# Patient Record
Sex: Female | Born: 1975
Health system: Southern US, Community
[De-identification: ages and names within clinical notes are randomized; demographics above are authoritative.]

## PROBLEM LIST (undated history)

## (undated) DIAGNOSIS — J309 Allergic rhinitis, unspecified: Secondary | ICD-10-CM

## (undated) DIAGNOSIS — K219 Gastro-esophageal reflux disease without esophagitis: Secondary | ICD-10-CM

## (undated) DIAGNOSIS — J189 Pneumonia, unspecified organism: Secondary | ICD-10-CM

## (undated) DIAGNOSIS — M722 Plantar fascial fibromatosis: Secondary | ICD-10-CM

## (undated) DIAGNOSIS — E785 Hyperlipidemia, unspecified: Secondary | ICD-10-CM

## (undated) DIAGNOSIS — J45909 Unspecified asthma, uncomplicated: Secondary | ICD-10-CM

## (undated) DIAGNOSIS — B029 Zoster without complications: Secondary | ICD-10-CM

## (undated) HISTORY — DX: Plantar fascial fibromatosis: M72.2

## (undated) HISTORY — DX: Zoster without complications: B02.9

## (undated) HISTORY — DX: Unspecified asthma, uncomplicated: J45.909

## (undated) HISTORY — PX: WISDOM TOOTH EXTRACTION: SHX21

## (undated) HISTORY — DX: Pneumonia, unspecified organism: J18.9

## (undated) HISTORY — DX: Gastro-esophageal reflux disease without esophagitis: K21.9

## (undated) HISTORY — DX: Hyperlipidemia, unspecified: E78.5

## (undated) HISTORY — DX: Allergic rhinitis, unspecified: J30.9

---

## 2001-10-26 ENCOUNTER — Other Ambulatory Visit: Admission: RE | Admit: 2001-10-26 | Discharge: 2001-10-26 | Payer: Self-pay | Admitting: *Deleted

## 2002-12-05 ENCOUNTER — Other Ambulatory Visit: Admission: RE | Admit: 2002-12-05 | Discharge: 2002-12-05 | Payer: Self-pay | Admitting: Obstetrics and Gynecology

## 2003-11-25 ENCOUNTER — Other Ambulatory Visit: Admission: RE | Admit: 2003-11-25 | Discharge: 2003-11-25 | Payer: Self-pay | Admitting: Obstetrics and Gynecology

## 2005-01-20 ENCOUNTER — Other Ambulatory Visit: Admission: RE | Admit: 2005-01-20 | Discharge: 2005-01-20 | Payer: Self-pay | Admitting: Obstetrics and Gynecology

## 2006-01-31 ENCOUNTER — Other Ambulatory Visit: Admission: RE | Admit: 2006-01-31 | Discharge: 2006-01-31 | Payer: Self-pay | Admitting: Obstetrics and Gynecology

## 2006-01-31 ENCOUNTER — Ambulatory Visit (HOSPITAL_COMMUNITY): Admission: RE | Admit: 2006-01-31 | Discharge: 2006-01-31 | Payer: Self-pay | Admitting: Obstetrics and Gynecology

## 2006-06-18 ENCOUNTER — Inpatient Hospital Stay (HOSPITAL_COMMUNITY): Admission: RE | Admit: 2006-06-18 | Discharge: 2006-06-20 | Payer: Self-pay | Admitting: Obstetrics and Gynecology

## 2008-10-03 ENCOUNTER — Inpatient Hospital Stay (HOSPITAL_COMMUNITY): Admission: AD | Admit: 2008-10-03 | Discharge: 2008-10-04 | Payer: Self-pay | Admitting: Obstetrics and Gynecology

## 2010-02-13 ENCOUNTER — Emergency Department (HOSPITAL_COMMUNITY): Admission: EM | Admit: 2010-02-13 | Discharge: 2010-02-13 | Payer: Self-pay | Admitting: Family Medicine

## 2010-02-13 ENCOUNTER — Emergency Department (HOSPITAL_COMMUNITY): Admission: EM | Admit: 2010-02-13 | Discharge: 2010-02-13 | Payer: Self-pay | Admitting: Emergency Medicine

## 2010-10-25 LAB — URINE MICROSCOPIC-ADD ON

## 2010-10-25 LAB — URINE CULTURE: Colony Count: 45000

## 2010-10-25 LAB — URINALYSIS, ROUTINE W REFLEX MICROSCOPIC
Ketones, ur: 15 mg/dL — AB
Leukocytes, UA: NEGATIVE
Nitrite: NEGATIVE
Protein, ur: 30 mg/dL — AB

## 2010-10-25 LAB — CBC
Hemoglobin: 12.6 g/dL (ref 12.0–15.0)
MCH: 31.2 pg (ref 26.0–34.0)
MCHC: 34.7 g/dL (ref 30.0–36.0)
RBC: 4.05 MIL/uL (ref 3.87–5.11)
WBC: 9.3 10*3/uL (ref 4.0–10.5)

## 2010-10-25 LAB — DIFFERENTIAL
Basophils Absolute: 0 10*3/uL (ref 0.0–0.1)
Basophils Relative: 0 % (ref 0–1)
Lymphocytes Relative: 8 % — ABNORMAL LOW (ref 12–46)
Monocytes Absolute: 0.6 10*3/uL (ref 0.1–1.0)

## 2010-10-25 LAB — COMPREHENSIVE METABOLIC PANEL
AST: 16 U/L (ref 0–37)
Alkaline Phosphatase: 46 U/L (ref 39–117)
BUN: 11 mg/dL (ref 6–23)
Calcium: 7.4 mg/dL — ABNORMAL LOW (ref 8.4–10.5)
Creatinine, Ser: 0.76 mg/dL (ref 0.4–1.2)
GFR calc non Af Amer: >60 mL/min (ref >60)
Sodium: 138 mEq/L (ref 135–145)

## 2010-10-25 LAB — POCT URINALYSIS DIP (DEVICE)
Protein, ur: 100 mg/dL — AB
Urobilinogen, UA: 0.2 mg/dL (ref 0.0–1.0)
pH: 6 (ref 5.0–8.0)

## 2010-11-24 LAB — CBC
HCT: 36.2 % (ref 36.0–46.0)
Hemoglobin: 12.1 g/dL (ref 12.0–15.0)
MCHC: 33.3 g/dL (ref 30.0–36.0)
MCHC: 33.4 g/dL (ref 30.0–36.0)
MCV: 89.8 fL (ref 78.0–100.0)
MCV: 91.3 fL (ref 78.0–100.0)
Platelets: 273 10*3/uL (ref 150–400)
Platelets: 303 10*3/uL (ref 150–400)
RBC: 3.96 MIL/uL (ref 3.87–5.11)
RBC: 4.43 MIL/uL (ref 3.87–5.11)
RDW: 13.6 % (ref 11.5–15.5)
RDW: 14.5 % (ref 11.5–15.5)
WBC: 17.2 10*3/uL — ABNORMAL HIGH (ref 4.0–10.5)

## 2010-11-24 LAB — RPR: RPR Ser Ql: NONREACTIVE

## 2010-12-22 NOTE — Discharge Summary (Signed)
NAMECATALEAH, STITES                 ACCOUNT NO.:  [id number]   MEDICAL RECORD NO.:  [id number]          PATIENT TYPE:  INP   LOCATION:  9130                          FACILITY:  [facility]   PHYSICIAN:  Malachi Pro. Ambrose Mantle, M.D. DATE OF BIRTH:  10/21/1975   DATE OF ADMISSION:  10/03/2008  DATE OF DISCHARGE:  10/04/2008                               DISCHARGE SUMMARY   A 35 year old white married female para 1-0-1-1, gravida 3, EDC October 06, 2008, admitted in labor.  Blood group and type A+, negative  antibody, nonreactive serology, rubella immune, hepatitis B surface  antigen negative, HIV negative, GC and Chlamydia negative, quad screen  negative, 1-hour Glucola 99, group B strep negative, and cystic fibrosis  negative.  Vaginal ultrasound on February 27, 2008, crown-rump length 1.8  cm, 8 weeks 2 days, [facility] October 06, 2008.  Ultrasound on May 07, 2008, average gestational age [redacted] weeks 1 day, [facility] October 07, 2008.  Echogenic intracardiac focus was noted in the left ventricle.  Prenatal  care was uncomplicated.   PAST MEDICAL HISTORY:   ALLERGIES:  CODEINE causes vomiting, illnesses, and urinary tract  infections in the past.   OPERATIONS:  Wisdom teeth extracted.   FAMILY HISTORY:  Grandparents with heart disease and high blood  pressure.  Maternal grandmother with ovarian cancer.  Maternal  grandfather prostate and pancreatic cancer and diabetes mellitus.  Alcohol, tobacco, and drugs none.   OBSTETRIC HISTORY:  In September 2008, the patient had a missed abortion  with a D&C in November 2007, a 6 pounds 15 ounces female delivered  vaginally.  Vital signs on admission were normal.  Heart and lungs were  normal.  The abdomen was soft.  The fundal height was 38 cm.  On  September 25, 2008, fetal heart tones were normal.  The cervix was 5-6 cm  per the MAU nurse.  The patient received an epidural at 4:00 a.m.  The  cervix was 7-cm dilated with a 100% vertex at a -2.  Artificial rupture  of the membranes produced clear fluid.  After the epidural, the patient  became hypotensive to 47/39 and fetal bradycardia occurred.  The patient  positioned on her side and given ephedrine, O2 was begun, and the fetal  heart rate recovered in 3 minutes.  At 4:30 a.m., the patient was fully  dilated.  She became uncomfortable and was crying in pain with pushing.  Her movements made tracing of the fetal heart rate difficult, so was 6  cm of caput showing.  A Kiwi vacuum was placed with one contraction and  no pop offs, a living female infant 6 pounds 9 ounces was delivered OA  over a second-degree midline laceration and a long right labial  laceration that extended to the clitoris.  The laceration also involved  the right vaginal wall.  Xylocaine 1% was used for the repair because  the patient was uncomfortable in spite of the epidural.  Placenta was  intact, the repair was done, and the uterus was normal.  The repair was  done with 3-0 Vicryl attempting  to put the right labia back to its  normal anatomy.  Postpartum, the patient did well.  On the first  postpartum day, she was ready for discharge.  I counseled her  significantly about the fact that it was 1 day earlier than the lot of  times the patient's use, but she and her husband felt that she would do  better at home.  There was plenty of family support.  I checked with the  pediatrician, Dr. Carlean Purl, and he had no objections to discharging  the baby.  Initial hemoglobin 13.2, hematocrit 39.8, white count 17,200,  and platelet count 303,000.  Followup hemoglobin 12.1, hematocrit 36.2,  and RPR nonreactive.   FINAL DIAGNOSES:  Intrauterine pregnancy, 39+ weeks, delivered occiput  anterior.   OPERATION:  Vacuum-assisted vaginal delivery OA, repair of extensive  right labial, vaginal laceration, and second-degree midline laceration.   FINAL CONDITION:  Improved.   INSTRUCTIONS:  Our regular discharge instruction booklet.   Percocet  5/325, 36 tablets 1 every 4-6 hours as needed for pain and Motrin 600 mg  30 tablets 1 every 6 hours as needed for pain.  She is to return in 6  weeks for followup examination.      Malachi Pro. Ambrose Mantle, M.D.  Electronically Signed     TFH/MEDQ  D:  10/04/2008  T:  10/04/2008  Job:  [id number]

## 2010-12-25 NOTE — Discharge Summary (Signed)
Cassandra Murphy, Cassandra Murphy                 ACCOUNT NO.:  [id number]   MEDICAL RECORD NO.:  [id number]          PATIENT TYPE:  INP   LOCATION:  9124                          [facility]ILITY:  [facility]   PHYSICIAN:  Sherron Monday, MD        DATE OF BIRTH:  Jul 08, 1976   DATE OF ADMISSION:  06/18/2006  DATE OF DISCHARGE:  06/20/2006                                 DISCHARGE SUMMARY   ADMISSION DIAGNOSES:  1. Intrauterine pregnancy at term.  2. Spontaneous rupture of membranes.   DISCHARGE DIAGNOSES:  1. Intrauterine pregnancy at term, delivered via spontaneous vaginal      delivery.  2. Spontaneous rupture of membranes.   HISTORY OF PRESENT ILLNESS:  A 35 year old G2, P0-0-1-0, at 40-0/7 weeks,  with SROM at 6:45 a.m.  She describes it as clear, pinkish fluid, good fetal  movement, vaginal bleeding that she describes as pinkish and contractions  approximately every 8 minutes.  States she has been having back labor.   PAST MEDICAL HISTORY:  Not significant.   PAST MEDICAL HISTORY:  Significant for a D&C.   PAST OB/GYN HISTORY:  G1 was a missed abortion, with a D&C.  G2 is present  pregnancy.  No complications.  History of an abnormal Pap smear with a  colposcopy, and it has been normal since.  No history of any sexually  transmitted diseases.   MEDICATIONS:  Prenatal vitamins.   ALLERGIES:  CODEINE, which causes nausea and vomiting.   SOCIAL HISTORY:  She denies alcohol, tobacco or drugs.  Married.   Of note, she got early prenatal care in [place], and these records have been  obtained.  On an anatomy scan, the infant is noted to have an EIF.  However,  otherwise normal anatomy at an approximately 20-week scan, confirming the  [facility] of June 18, 2006.   On admission she was afebrile, vital signs are stable.  Fetal heart tones in  the 130s and reactive.  Contractions every 3 minutes.  Sterile vaginal exam  was 6, 90, and 0.   PRENATAL LABORATORY DATA:  HIV negative.  Hepatitis B surface antigen  negative.  Rubella immune.  RPR nonreactive.  She was A positive, antibody  screen negative.  Platelets 268,000.  Group B strep negative.  Glucola 83.  Her dates were confirmed by a first trimester ultrasound in [place].   This 35 year old at 40 weeks in active labor was admitted.  We anticipated a  vaginal delivery.  Group B strep was negative, so there was no need for  prophylaxis.  She was monitored closely.  She progressed to complete,  complete, +2, fetal position was noted to be in OT.  After pushing for  approximately 45 minutes, a combination of manual rotation and exaggerated  left Sims position, turning her epidural down, we delayed pushing for an  hour to help the baby rotate, and she went on to push to deliver a viable  female infant at 28 with Apgars of 8 at one minute and 9 at five minutes  and a weight of 6 pounds 15 ounces.  Midline episiotomy was cut.  Placenta  was manually extracted in its entirety after inspection.  A second degree  perineal laceration was repaired in typical fashion with 3-0 Vicryl.  Estimated blood loss was approximately 500 mL.  Her postpartum course was  relatively uncomplicated.  She remained afebrile with vital signs stable,  and her hemoglobin decreased from 14.7 to 13.4.  On the day of discharge she  complained of some fullness and swelling in her vulva, had normal lochia,  and her pain was well-controlled.  She was discharged to home with routine  discharge instructions and numbers to call if any questions or problems.  She was also given prescriptions for Motrin, Vicodin, Colace, and prenatal  vitamins.  She will follow up for a 6-week check-up.  She was A positive,  rubella immune, plans to breast-feed, and will start oral contraceptive at  her 6-week check-up.      Sherron Monday, MD  Electronically Signed     JB/MEDQ  D:  06/20/2006  T:  06/20/2006  Job:  [id number]

## 2011-05-18 ENCOUNTER — Inpatient Hospital Stay (INDEPENDENT_AMBULATORY_CARE_PROVIDER_SITE_OTHER)
Admission: RE | Admit: 2011-05-18 | Discharge: 2011-05-18 | Disposition: A | Discharge status: Home / Self Care | Payer: BC Managed Care – PPO | Source: Ambulatory Visit | Attending: Emergency Medicine | Admitting: Emergency Medicine

## 2011-05-18 DIAGNOSIS — J40 Bronchitis, not specified as acute or chronic: Secondary | ICD-10-CM

## 2011-05-18 LAB — POCT URINALYSIS DIP (DEVICE)
Specific Gravity, Urine: 1.025 (ref 1.005–1.030)
pH: 5.5 (ref 5.0–8.0)

## 2011-05-18 LAB — POCT PREGNANCY, URINE: Preg Test, Ur: NEGATIVE

## 2012-12-21 ENCOUNTER — Ambulatory Visit (INDEPENDENT_AMBULATORY_CARE_PROVIDER_SITE_OTHER): Payer: BC Managed Care – PPO | Admitting: Psychology

## 2012-12-21 DIAGNOSIS — IMO0002 Reserved for concepts with insufficient information to code with codable children: Secondary | ICD-10-CM

## 2015-09-16 ENCOUNTER — Other Ambulatory Visit: Payer: Self-pay | Admitting: Obstetrics and Gynecology

## 2015-09-16 DIAGNOSIS — R928 Other abnormal and inconclusive findings on diagnostic imaging of breast: Secondary | ICD-10-CM

## 2015-09-25 ENCOUNTER — Ambulatory Visit
Admission: RE | Admit: 2015-09-25 | Discharge: 2015-09-25 | Disposition: A | Discharge status: Home / Self Care | Payer: BLUE CROSS/BLUE SHIELD | Source: Ambulatory Visit | Attending: Obstetrics and Gynecology | Admitting: Obstetrics and Gynecology

## 2015-09-25 DIAGNOSIS — R928 Other abnormal and inconclusive findings on diagnostic imaging of breast: Secondary | ICD-10-CM

## 2015-11-10 ENCOUNTER — Ambulatory Visit (INDEPENDENT_AMBULATORY_CARE_PROVIDER_SITE_OTHER): Payer: BLUE CROSS/BLUE SHIELD | Admitting: Sports Medicine

## 2015-11-10 ENCOUNTER — Encounter: Payer: Self-pay | Admitting: Sports Medicine

## 2015-11-10 VITALS — BP 111/75 | Ht 68.0 in | Wt 165.0 lb

## 2015-11-10 DIAGNOSIS — M25571 Pain in right ankle and joints of right foot: Secondary | ICD-10-CM

## 2015-11-10 DIAGNOSIS — M357 Hypermobility syndrome: Secondary | ICD-10-CM

## 2015-11-10 DIAGNOSIS — Q796 Ehlers-Danlos syndrome: Secondary | ICD-10-CM | POA: Diagnosis not present

## 2015-11-10 DIAGNOSIS — Q7962 Hypermobile Ehlers-Danlos syndrome: Secondary | ICD-10-CM | POA: Insufficient documentation

## 2015-11-10 DIAGNOSIS — M25371 Other instability, right ankle: Secondary | ICD-10-CM

## 2015-11-10 DIAGNOSIS — M25373 Other instability, unspecified ankle: Secondary | ICD-10-CM | POA: Insufficient documentation

## 2015-11-10 NOTE — Assessment & Plan Note (Signed)
Follow but use HEP for prevention

## 2015-11-10 NOTE — Assessment & Plan Note (Signed)
HEP Emphasize balance and proprioception Ankle compression sleeve for exercise on RT  Reck pending response Needs good foot support as well

## 2015-11-10 NOTE — Progress Notes (Signed)
Patient ID: Cassandra Murphy, female   DOB: April 10, 1976, 40 y.o.   MRN: [id number]  CC: RT ankle pain  2 wks ago turned RT ankle walking in house Mild swelling Has had multiple ankle sprains on RT Lt ankle has been OK Sprains resolve quickly but has stopped tennis and exercises that involve jumping  Hx of PF pain on RT  Hx of a lot of flexibility - for Yoga, etc  Soc HX: Does regular exercise Non smoker Non drinker  ROS Denies swelling of other joints Slight tingling and numbness of RT foot dorsum and ankle  Gen WD/WN W F NAD BP 111/75 mmHg  Ht 5\' 8"  (1.727 m)  Wt 165 lb (74.844 kg)  BMI 25.09 kg/m2 Beighton score of 3 Hands to floor and thumbs to wrist Increased laxity of other joints  Lt ankle Ankle: No visible erythema or swelling. Range of motion is full in all directions. Strength is 5/5 in all directions. Stable but increased motion of lateral and medial ligaments;  + drawer squeeze test and kleiger test unremarkable; Talar dome nontender; No pain at base of 5th MT; No tenderness over cuboid; No tenderness over N spot or navicular prominence No tenderness on posterior aspects of lateral and medial malleolus No sign of peroneal tendon subluxations; Negative tarsal tunnel tinel's Able to walk 4 steps.  RT ankle Ankle: No visible erythema or swelling. Range of motion is full in all directions. Strength is 5/5 in all directions. unstable lateral ligaments with no good endpoints and + drawer stable medial ligaments; squeeze test and kleiger test unremarkable; Talar dome nontender; No pain at base of 5th MT; No tenderness over cuboid; No tenderness over N spot or navicular prominence No tenderness on posterior aspects of lateral and medial malleolus No sign of peroneal tendon subluxations; Negative tarsal tunnel tinel's Able to walk 4 steps.  PF is not tender  Feet: collapse of long arch bilat Slaying toes 1/2 Eversion of foot on ankle Partially corrects  with gait

## 2015-11-12 ENCOUNTER — Ambulatory Visit: Payer: BLUE CROSS/BLUE SHIELD | Admitting: Sports Medicine

## 2015-11-25 DIAGNOSIS — J3489 Other specified disorders of nose and nasal sinuses: Secondary | ICD-10-CM | POA: Insufficient documentation

## 2016-04-08 ENCOUNTER — Encounter (INDEPENDENT_AMBULATORY_CARE_PROVIDER_SITE_OTHER): Payer: Self-pay

## 2016-04-08 ENCOUNTER — Encounter: Payer: Self-pay | Admitting: Allergy & Immunology

## 2016-04-08 ENCOUNTER — Ambulatory Visit (INDEPENDENT_AMBULATORY_CARE_PROVIDER_SITE_OTHER): Payer: BLUE CROSS/BLUE SHIELD | Admitting: Allergy & Immunology

## 2016-04-08 VITALS — BP 110/70 | HR 88 | Temp 98.4°F | Resp 16 | Ht 67.0 in | Wt 169.0 lb

## 2016-04-08 DIAGNOSIS — J310 Chronic rhinitis: Secondary | ICD-10-CM

## 2016-04-08 DIAGNOSIS — J4530 Mild persistent asthma, uncomplicated: Secondary | ICD-10-CM

## 2016-04-08 MED ORDER — ALBUTEROL SULFATE 108 (90 BASE) MCG/ACT IN AEPB: 2.0000 | INHALATION_SPRAY | RESPIRATORY_TRACT | 1 refills | Status: DC | PRN

## 2016-04-08 NOTE — Progress Notes (Signed)
FOLLOW UP  Date of Service/Encounter:  04/08/16   Assessment:   Chronic rhinitis  Mild persistent asthma, uncomplicated   Asthma Reportables:  Severity: : mild persistent  Risk: low Control: well controlled  Seasonal Influenza Vaccine: no but encouraged    Plan/Recommendations:    1. Chronic rhinitis - We will mixed the allergen vials. - Make an appointment in two weeks for the first injection. - Continue with all of your allergy medications for now.  2. Mild persistent asthma, uncomplicated - You did have some reversibility on your breathing test today. - I feel that Cassandra Murphy lacks awareness of her symptoms and brushes aside the respiratory symptoms. - Given the mild reversibility as well as her symptoms multiple times per week, she may benefit from a daily inhaled steroid.  - Sample of Qvar 40mcg provided today (two puffs twice daily)/  - Use the spacer every time.  - Sample provided in clinic today.  - Start ProAir Respiclick two puffs before physical activity.  3. Return to clinic in one month so we can see how you are doing.     Subjective:   Cassandra Murphy is a 40 y.o. female presenting today for follow up of  Chief Complaint  Patient presents with  . Allergies    pt is having a hard time getting her allergies controlled by medications.  Cassandra Murphy.  Cassandra Murphy has a history of the following: Patient Active Problem List   Diagnosis Date Noted  . Instability of right ankle joint 11/10/2015  . Benign hypermobility syndrome 11/10/2015    History obtained from: chart review and patient.  Cassandra Murphy was referred by Ezequiel KayserPERINI,MARK A, MD.     Cassandra Murphy is a 40 y.o. female presenting for a follow up visit for allergic rhinitis and shortness of breath. She was last seen in December 2014 by Dr. Beaulah DinningBardelas, at that time, she was started on Allegra 180 mg in the morning cetirizine 10 mg at night. She was started on diagnosed in 1 spray per nostril twice a day. Singulair was  also added. She was continued on Ventolin 2 puffs every 4 hours when necessary wheezing and coughing. She was asked to follow up in 2 months, however she presents today nearly 3 years later.  Since last visit, Cassandra Murphy reports that she has been fair. Currently she is taking Allegra one tablet daily and Singulair. This does not seem to cut her symptoms. She estimates that she has 1-2 days per month that she sneezes nonstop for around 12 hours at a time. She does not use a nasal spray very well. She does not use it consistently to see that it makes a difference. She reports that she just doesn't remember to take medications is interested in trying shots as a means of controlling her symptoms.  Cassandra Murphy does have a history of asthma. At the last visit, she was put on Singulair as well as Ventolin when necessary. She does think that she was on a daily inhaled steroid at some point. She is unsure if that helps and she did not use it regularly. However, she reports that she has shortness of breath 3-4 times a week especially around exercise. She has not needed prednisone since the last visit and has required no ER visits or urgent care visits for breathing. She does have intermittent nighttime coughing.   Otherwise, there have been no changes to the past medical history, surgical history, family history, or social history. 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 She has 2 children a 56-year-old a 4-year-old at home. There is no smoking. Cassandra Murphy reports that she had no problems with allergies or asthma until she lived in [place] for 2-1/2 years for her husband job. She does report that there was increased air pollution.    Review of Systems: a 14-point review of systems is pertinent for what is mentioned in HPI.  Otherwise, all other systems were negative. Constitutional: negative other than that listed in the HPI Eyes: negative other than that listed in the HPI Ears, nose,  mouth, throat, and face: negative other than that listed in the HPI Respiratory: negative other than that listed in the HPI Cardiovascular: negative other than that listed in the HPI Gastrointestinal: negative other than that listed in the HPI Genitourinary: negative other than that listed in the HPI Integument: negative other than that listed in the HPI Hematologic: negative other than that listed in the HPI Musculoskeletal: negative other than that listed in the HPI Neurological: negative other than that listed in the HPI Allergy/Immunologic: negative other than that listed in the HPI    Objective:   Blood pressure 110/70, pulse 88, temperature 98.4 F (36.9 C), temperature source Oral, resp. rate 16, height 5\' 7"  (1.702 m), weight 169 lb (76.7 kg). Body mass index is 26.47 kg/m.   Physical Exam:  General: Alert, interactive, in no acute distress. Nasal sounding voice. HEENT: TMs pearly gray, turbinates edematous and pale with clear discharge, post-pharynx erythematous. Neck: Supple without thyromegaly. Adenopathy: no enlarged lymph nodes appreciated in the anterior cervical, occipital, axillary, epitrochlear, inguinal, or popliteal regions Lungs: Clear to auscultation without wheezing, rhonchi or rales. No increased work of breathing. CV: Normal S1, S2 without murmurs. Capillary refill <2 seconds.  Abdomen: Nondistended, nontender. Skin: Warm and dry, without lesions or rashes. Extremities:  No clubbing, cyanosis or edema. Neuro:   Grossly intact.   Diagnostic studies:  Spirometry: results normal (FEV1: 3.08/101%, FVC: 4.31/118%, FEV1/FVC: 87%).    Spirometry consistent with normal pattern. We did give her bronchodilator with modest, though not significant, improvement in lung values.     Malachi Bonds, MD FAAAAI [facility]

## 2016-04-08 NOTE — Patient Instructions (Signed)
1. Chronic rhinitis - We will order the allergy shots. - Make an appointment in two weeks for the first injection. - Continue with all of your allergy medications for now.  2. Mild persistent asthma, uncomplicated - You did have some reversibility on your breathing test today. - You would benefit from an inhaled steroid.  - We will give you a sample of Qvar two puffs twice daily. - Use the spacer every time.  - Start ProAir Respiclick. You can use two puffs before physical activity. - It is not normal to wheeze!   3. Return to clinic in one month so we can see how you are doing.   It was a pleasure meeting you today!

## 2016-05-06 ENCOUNTER — Ambulatory Visit: Payer: BLUE CROSS/BLUE SHIELD | Admitting: Allergy & Immunology

## 2016-05-26 ENCOUNTER — Ambulatory Visit: Payer: BLUE CROSS/BLUE SHIELD | Admitting: Allergy & Immunology

## 2016-05-27 ENCOUNTER — Encounter (INDEPENDENT_AMBULATORY_CARE_PROVIDER_SITE_OTHER): Payer: Self-pay

## 2016-05-27 ENCOUNTER — Ambulatory Visit (INDEPENDENT_AMBULATORY_CARE_PROVIDER_SITE_OTHER): Payer: BLUE CROSS/BLUE SHIELD | Admitting: Allergy & Immunology

## 2016-05-27 VITALS — BP 100/78 | HR 70 | Temp 98.3°F

## 2016-05-27 DIAGNOSIS — J309 Allergic rhinitis, unspecified: Secondary | ICD-10-CM

## 2016-05-27 DIAGNOSIS — J4530 Mild persistent asthma, uncomplicated: Secondary | ICD-10-CM | POA: Diagnosis not present

## 2016-05-27 MED ORDER — EPINEPHRINE 0.3 MG/0.3ML IJ SOAJ: INTRAMUSCULAR | 3 refills | Status: DC

## 2016-05-27 NOTE — Patient Instructions (Addendum)
1. Chronic allergic rhinitis - We will mix up the allergy vials.  - Make an appointment in two weeks for the first injection. - Immunotherapy Consent signed today.  2. Mild persistent asthma, uncomplicated - Continue with Sigulair 10mg . - Use ProAir 2 puffs 15 minutes prior to physical activity.  3. Return in about 2 weeks (around 06/10/2016).  Please inform us of any Emergency Department visits, hospitalizations, or changes in symptoms. Call us before going to the ED for breathing or allergy symptoms since we might be able to fit you in for a sick visit. Feel free to contact us anytime with any questions, problems, or concerns.  It was a pleasure to see you again today!   Websites that have reliable patient information: 1. American Academy of Asthma, Allergy, and Immunology: www.aaaai.org 2. Food Allergy Research and Education (FARE): foodallergy.org 3. Mothers of Asthmatics: http://www.asthmacommunitynetwork.org 4. American College of Allergy, Asthma, and Immunology: www.acaai.org

## 2016-05-27 NOTE — Progress Notes (Signed)
FOLLOW UP  Date of Service/Encounter:  05/27/16   Assessment:   Chronic allergic rhinitis, unspecified seasonality, unspecified trigger  Mild persistent asthma, uncomplicated   Plan/Recommendations:   1. Chronic allergic rhinitis - We will mix up the allergy vials.   - Make an appointment in two weeks for the first injection. - We had a long discussion again about the benefits of allergy injections, the time commitment, the mechanisms of action, and long term expectations.  Erskine Squibb- Alsha seemed to be more willing to go through with shots once I explained that we would space out the injections over time.  - Immunotherapy Consent signed today.   2. Mild persistent asthma, uncomplicated - Continue with Sigulair 10mg . - Use ProAir 2 puffs 15 minutes prior to physical activity.  3. Return in about 2 weeks (around 06/10/2016).   Subjective:   Ardine BjorkJane W Gaw is a 40 y.o. female presenting today for follow up of  Chief Complaint  Patient presents with  . Asthma    ? about allergy injections  .  Ardine BjorkJane W Snee has a history of the following: Patient Active Problem List   Diagnosis Date Noted  . Instability of right ankle joint 11/10/2015  . Benign hypermobility syndrome 11/10/2015    History obtained from: chart review and patient.  Ardine BjorkJane W Uehara was referred by Ezequiel KayserPERINI,MARK A, MD.     Erskine SquibbJane is a 40 y.o. female presenting for a follow up visit. Erskine SquibbJane was last seen in August 2017. At that time, she did have spirometry findings consistent with asthma and she was endorsing symptoms multiple times per week. Therefore I recommended adding Qvar two puffs BID. I also recommended starting allergy shots as a means of controlling her allergy symptoms which were a problem multiple times per month even on maximized therapies. I felt at that time that her awareness of symptoms was contributing to her overall hesitation to buy into treatment.   She has done well since the last visit. She has not  been using the Qvar routinely, but his PCP felt that it was "overkill" and recommended that she stop it. She continues to premedicate with albuterol prior to physical activity which seems to work well for her. Donnie's asthma has been well controlled. She has not required rescue medication, experienced nocturnal awakenings due to lower respiratory symptoms, nor have activities of daily living been limited.    Erskine SquibbJane remains on the fence about starting allergy shots. She does have constant sneezing and "overwhelming" rhinorrhea sporadically multiple times each month through the year. This is despite being on maximized therapies. Currently she is on Allegra and Singulair 10mg  daily. She is not on any nasal steroids currently but from the past notes it appears that she has not taken them regularly enough to see a difference. She is specifically concerned about the time commitment needed for the shots.   Otherwise, there have been no changes to her past medical history, surgical history, family history, or social history.    Review of Systems: a 14-point review of systems is pertinent for what is mentioned in HPI.  Otherwise, all other systems were negative. Constitutional: negative other than that listed in the HPI Eyes: negative other than that listed in the HPI Ears, nose, mouth, throat, and face: negative other than that listed in the HPI Respiratory: negative other than that listed in the HPI Cardiovascular: negative other than that listed in the HPI Gastrointestinal: negative other than that listed in the HPI Genitourinary: negative other  than that listed in the HPI Integument: negative other than that listed in the HPI Hematologic: negative other than that listed in the HPI Musculoskeletal: negative other than that listed in the HPI Neurological: negative other than that listed in the HPI Allergy/Immunologic: negative other than that listed in the HPI    Objective:   Blood pressure 100/78,  pulse 70, temperature 98.3 F (36.8 C), temperature source Oral. There is no height or weight on file to calculate BMI.   Physical Exam:  General: Alert, interactive, in no acute distress. Pleasant female wearing gym clothes. HEENT: TMs pearly gray, turbinates markedly edematous and pale with clear discharge, post-pharynx erythematous. Neck: Supple without thyromegaly. Lungs: Clear to auscultation without wheezing, rhonchi or rales. No increased work of breathing. CV: Normal S1/S2, no murmurs. Capillary refill <2 seconds.  Abdomen: Nondistended, nontender. No guarding or rebound tenderness. Bowel sounds present in all Joe  Skin: Warm and dry, without lesions or rashes. Extremities:  No clubbing, cyanosis or edema. Neuro:   Grossly intact.  Diagnostic studies: None    Malachi Bonds, MD [facility]

## 2016-05-28 NOTE — Addendum Note (Signed)
Addended by: Alfonse SpruceGALLAGHER, JOEL LOUIS on: 05/28/2016 11:20 AM   Modules accepted: Orders

## 2016-06-01 DIAGNOSIS — J3089 Other allergic rhinitis: Secondary | ICD-10-CM

## 2016-06-01 NOTE — Progress Notes (Unsigned)
Vials made 06-02-16.  Jm

## 2016-06-02 DIAGNOSIS — J301 Allergic rhinitis due to pollen: Secondary | ICD-10-CM

## 2016-06-10 ENCOUNTER — Ambulatory Visit (INDEPENDENT_AMBULATORY_CARE_PROVIDER_SITE_OTHER): Payer: BLUE CROSS/BLUE SHIELD | Admitting: *Deleted

## 2016-06-10 DIAGNOSIS — J309 Allergic rhinitis, unspecified: Secondary | ICD-10-CM

## 2016-06-14 ENCOUNTER — Ambulatory Visit (INDEPENDENT_AMBULATORY_CARE_PROVIDER_SITE_OTHER): Payer: BLUE CROSS/BLUE SHIELD | Admitting: *Deleted

## 2016-06-14 DIAGNOSIS — J309 Allergic rhinitis, unspecified: Secondary | ICD-10-CM

## 2016-06-16 ENCOUNTER — Ambulatory Visit (INDEPENDENT_AMBULATORY_CARE_PROVIDER_SITE_OTHER): Payer: BLUE CROSS/BLUE SHIELD | Admitting: *Deleted

## 2016-06-16 DIAGNOSIS — J309 Allergic rhinitis, unspecified: Secondary | ICD-10-CM | POA: Diagnosis not present

## 2016-06-21 ENCOUNTER — Ambulatory Visit (INDEPENDENT_AMBULATORY_CARE_PROVIDER_SITE_OTHER): Payer: BLUE CROSS/BLUE SHIELD | Admitting: *Deleted

## 2016-06-21 DIAGNOSIS — J309 Allergic rhinitis, unspecified: Secondary | ICD-10-CM | POA: Diagnosis not present

## 2016-06-23 ENCOUNTER — Ambulatory Visit (INDEPENDENT_AMBULATORY_CARE_PROVIDER_SITE_OTHER): Payer: BLUE CROSS/BLUE SHIELD | Admitting: *Deleted

## 2016-06-23 DIAGNOSIS — J309 Allergic rhinitis, unspecified: Secondary | ICD-10-CM

## 2016-06-29 ENCOUNTER — Ambulatory Visit (INDEPENDENT_AMBULATORY_CARE_PROVIDER_SITE_OTHER): Payer: BLUE CROSS/BLUE SHIELD | Admitting: *Deleted

## 2016-06-29 DIAGNOSIS — J309 Allergic rhinitis, unspecified: Secondary | ICD-10-CM | POA: Diagnosis not present

## 2016-07-06 ENCOUNTER — Ambulatory Visit (INDEPENDENT_AMBULATORY_CARE_PROVIDER_SITE_OTHER): Payer: BLUE CROSS/BLUE SHIELD | Admitting: *Deleted

## 2016-07-06 DIAGNOSIS — J309 Allergic rhinitis, unspecified: Secondary | ICD-10-CM

## 2016-07-09 ENCOUNTER — Ambulatory Visit (INDEPENDENT_AMBULATORY_CARE_PROVIDER_SITE_OTHER): Payer: BLUE CROSS/BLUE SHIELD

## 2016-07-09 DIAGNOSIS — J309 Allergic rhinitis, unspecified: Secondary | ICD-10-CM

## 2016-07-12 ENCOUNTER — Ambulatory Visit (INDEPENDENT_AMBULATORY_CARE_PROVIDER_SITE_OTHER): Payer: BLUE CROSS/BLUE SHIELD | Admitting: *Deleted

## 2016-07-12 DIAGNOSIS — J309 Allergic rhinitis, unspecified: Secondary | ICD-10-CM

## 2016-07-15 ENCOUNTER — Ambulatory Visit (INDEPENDENT_AMBULATORY_CARE_PROVIDER_SITE_OTHER): Payer: BLUE CROSS/BLUE SHIELD

## 2016-07-15 DIAGNOSIS — J309 Allergic rhinitis, unspecified: Secondary | ICD-10-CM | POA: Diagnosis not present

## 2016-07-19 ENCOUNTER — Ambulatory Visit (INDEPENDENT_AMBULATORY_CARE_PROVIDER_SITE_OTHER): Payer: BLUE CROSS/BLUE SHIELD | Admitting: *Deleted

## 2016-07-19 DIAGNOSIS — J309 Allergic rhinitis, unspecified: Secondary | ICD-10-CM | POA: Diagnosis not present

## 2016-07-22 ENCOUNTER — Ambulatory Visit (INDEPENDENT_AMBULATORY_CARE_PROVIDER_SITE_OTHER): Payer: BLUE CROSS/BLUE SHIELD

## 2016-07-22 DIAGNOSIS — J309 Allergic rhinitis, unspecified: Secondary | ICD-10-CM | POA: Diagnosis not present

## 2016-07-26 ENCOUNTER — Ambulatory Visit (INDEPENDENT_AMBULATORY_CARE_PROVIDER_SITE_OTHER): Payer: BLUE CROSS/BLUE SHIELD | Admitting: *Deleted

## 2016-07-26 DIAGNOSIS — J309 Allergic rhinitis, unspecified: Secondary | ICD-10-CM

## 2016-07-30 ENCOUNTER — Ambulatory Visit (INDEPENDENT_AMBULATORY_CARE_PROVIDER_SITE_OTHER): Payer: BLUE CROSS/BLUE SHIELD

## 2016-07-30 DIAGNOSIS — J309 Allergic rhinitis, unspecified: Secondary | ICD-10-CM

## 2016-08-16 ENCOUNTER — Ambulatory Visit (INDEPENDENT_AMBULATORY_CARE_PROVIDER_SITE_OTHER): Payer: BLUE CROSS/BLUE SHIELD | Admitting: *Deleted

## 2016-08-16 DIAGNOSIS — J309 Allergic rhinitis, unspecified: Secondary | ICD-10-CM | POA: Diagnosis not present

## 2016-08-19 ENCOUNTER — Ambulatory Visit (INDEPENDENT_AMBULATORY_CARE_PROVIDER_SITE_OTHER): Payer: BLUE CROSS/BLUE SHIELD

## 2016-08-19 DIAGNOSIS — J309 Allergic rhinitis, unspecified: Secondary | ICD-10-CM

## 2016-08-23 ENCOUNTER — Ambulatory Visit (INDEPENDENT_AMBULATORY_CARE_PROVIDER_SITE_OTHER): Payer: BLUE CROSS/BLUE SHIELD | Admitting: *Deleted

## 2016-08-23 DIAGNOSIS — J309 Allergic rhinitis, unspecified: Secondary | ICD-10-CM

## 2016-08-27 ENCOUNTER — Ambulatory Visit (INDEPENDENT_AMBULATORY_CARE_PROVIDER_SITE_OTHER): Payer: BLUE CROSS/BLUE SHIELD | Admitting: *Deleted

## 2016-08-27 DIAGNOSIS — J309 Allergic rhinitis, unspecified: Secondary | ICD-10-CM | POA: Diagnosis not present

## 2016-08-31 ENCOUNTER — Ambulatory Visit (INDEPENDENT_AMBULATORY_CARE_PROVIDER_SITE_OTHER): Payer: BLUE CROSS/BLUE SHIELD | Admitting: *Deleted

## 2016-08-31 DIAGNOSIS — J309 Allergic rhinitis, unspecified: Secondary | ICD-10-CM

## 2016-09-06 ENCOUNTER — Ambulatory Visit (INDEPENDENT_AMBULATORY_CARE_PROVIDER_SITE_OTHER): Payer: BLUE CROSS/BLUE SHIELD | Admitting: *Deleted

## 2016-09-06 DIAGNOSIS — J309 Allergic rhinitis, unspecified: Secondary | ICD-10-CM | POA: Diagnosis not present

## 2016-09-09 ENCOUNTER — Ambulatory Visit (INDEPENDENT_AMBULATORY_CARE_PROVIDER_SITE_OTHER): Payer: BLUE CROSS/BLUE SHIELD | Admitting: *Deleted

## 2016-09-09 DIAGNOSIS — J309 Allergic rhinitis, unspecified: Secondary | ICD-10-CM

## 2016-09-16 ENCOUNTER — Ambulatory Visit (INDEPENDENT_AMBULATORY_CARE_PROVIDER_SITE_OTHER): Payer: BLUE CROSS/BLUE SHIELD

## 2016-09-16 DIAGNOSIS — J309 Allergic rhinitis, unspecified: Secondary | ICD-10-CM | POA: Diagnosis not present

## 2016-09-22 ENCOUNTER — Ambulatory Visit (INDEPENDENT_AMBULATORY_CARE_PROVIDER_SITE_OTHER): Payer: BLUE CROSS/BLUE SHIELD

## 2016-09-22 DIAGNOSIS — J309 Allergic rhinitis, unspecified: Secondary | ICD-10-CM

## 2016-09-29 ENCOUNTER — Ambulatory Visit (INDEPENDENT_AMBULATORY_CARE_PROVIDER_SITE_OTHER): Payer: BLUE CROSS/BLUE SHIELD | Admitting: *Deleted

## 2016-09-29 DIAGNOSIS — J309 Allergic rhinitis, unspecified: Secondary | ICD-10-CM | POA: Diagnosis not present

## 2016-10-04 ENCOUNTER — Other Ambulatory Visit: Payer: Self-pay | Admitting: Obstetrics and Gynecology

## 2016-10-04 DIAGNOSIS — N6489 Other specified disorders of breast: Secondary | ICD-10-CM

## 2016-10-06 ENCOUNTER — Ambulatory Visit (INDEPENDENT_AMBULATORY_CARE_PROVIDER_SITE_OTHER): Payer: BLUE CROSS/BLUE SHIELD | Admitting: *Deleted

## 2016-10-06 DIAGNOSIS — J309 Allergic rhinitis, unspecified: Secondary | ICD-10-CM | POA: Diagnosis not present

## 2016-10-12 ENCOUNTER — Ambulatory Visit
Admission: RE | Admit: 2016-10-12 | Discharge: 2016-10-12 | Disposition: A | Discharge status: Home / Self Care | Payer: BLUE CROSS/BLUE SHIELD | Source: Ambulatory Visit | Attending: Obstetrics and Gynecology | Admitting: Obstetrics and Gynecology

## 2016-10-12 DIAGNOSIS — N6489 Other specified disorders of breast: Secondary | ICD-10-CM

## 2016-10-13 ENCOUNTER — Ambulatory Visit (INDEPENDENT_AMBULATORY_CARE_PROVIDER_SITE_OTHER): Payer: BLUE CROSS/BLUE SHIELD | Admitting: *Deleted

## 2016-10-13 DIAGNOSIS — J309 Allergic rhinitis, unspecified: Secondary | ICD-10-CM

## 2016-10-20 ENCOUNTER — Ambulatory Visit (INDEPENDENT_AMBULATORY_CARE_PROVIDER_SITE_OTHER): Payer: BLUE CROSS/BLUE SHIELD

## 2016-10-20 DIAGNOSIS — J309 Allergic rhinitis, unspecified: Secondary | ICD-10-CM | POA: Diagnosis not present

## 2016-10-27 ENCOUNTER — Ambulatory Visit (INDEPENDENT_AMBULATORY_CARE_PROVIDER_SITE_OTHER): Payer: BLUE CROSS/BLUE SHIELD

## 2016-10-27 DIAGNOSIS — J309 Allergic rhinitis, unspecified: Secondary | ICD-10-CM | POA: Diagnosis not present

## 2016-11-02 ENCOUNTER — Ambulatory Visit (INDEPENDENT_AMBULATORY_CARE_PROVIDER_SITE_OTHER): Payer: BLUE CROSS/BLUE SHIELD

## 2016-11-02 DIAGNOSIS — J309 Allergic rhinitis, unspecified: Secondary | ICD-10-CM | POA: Diagnosis not present

## 2016-11-12 DIAGNOSIS — J301 Allergic rhinitis due to pollen: Secondary | ICD-10-CM

## 2016-11-16 ENCOUNTER — Ambulatory Visit (INDEPENDENT_AMBULATORY_CARE_PROVIDER_SITE_OTHER): Payer: BLUE CROSS/BLUE SHIELD | Admitting: *Deleted

## 2016-11-16 DIAGNOSIS — J309 Allergic rhinitis, unspecified: Secondary | ICD-10-CM

## 2016-11-23 ENCOUNTER — Ambulatory Visit (INDEPENDENT_AMBULATORY_CARE_PROVIDER_SITE_OTHER): Payer: BLUE CROSS/BLUE SHIELD | Admitting: *Deleted

## 2016-11-23 DIAGNOSIS — J309 Allergic rhinitis, unspecified: Secondary | ICD-10-CM | POA: Diagnosis not present

## 2016-12-01 ENCOUNTER — Ambulatory Visit (INDEPENDENT_AMBULATORY_CARE_PROVIDER_SITE_OTHER): Payer: BLUE CROSS/BLUE SHIELD | Admitting: *Deleted

## 2016-12-01 DIAGNOSIS — J309 Allergic rhinitis, unspecified: Secondary | ICD-10-CM

## 2016-12-08 ENCOUNTER — Encounter: Payer: Self-pay | Admitting: Internal Medicine

## 2016-12-10 ENCOUNTER — Ambulatory Visit (INDEPENDENT_AMBULATORY_CARE_PROVIDER_SITE_OTHER): Payer: BLUE CROSS/BLUE SHIELD

## 2016-12-10 DIAGNOSIS — J309 Allergic rhinitis, unspecified: Secondary | ICD-10-CM

## 2016-12-17 ENCOUNTER — Ambulatory Visit (INDEPENDENT_AMBULATORY_CARE_PROVIDER_SITE_OTHER): Payer: BLUE CROSS/BLUE SHIELD

## 2016-12-17 DIAGNOSIS — J309 Allergic rhinitis, unspecified: Secondary | ICD-10-CM | POA: Diagnosis not present

## 2016-12-22 ENCOUNTER — Ambulatory Visit (INDEPENDENT_AMBULATORY_CARE_PROVIDER_SITE_OTHER): Payer: BLUE CROSS/BLUE SHIELD

## 2016-12-22 DIAGNOSIS — J309 Allergic rhinitis, unspecified: Secondary | ICD-10-CM | POA: Diagnosis not present

## 2016-12-28 ENCOUNTER — Ambulatory Visit (INDEPENDENT_AMBULATORY_CARE_PROVIDER_SITE_OTHER): Payer: BLUE CROSS/BLUE SHIELD | Admitting: *Deleted

## 2016-12-28 DIAGNOSIS — J309 Allergic rhinitis, unspecified: Secondary | ICD-10-CM | POA: Diagnosis not present

## 2017-01-06 ENCOUNTER — Ambulatory Visit (INDEPENDENT_AMBULATORY_CARE_PROVIDER_SITE_OTHER): Payer: BLUE CROSS/BLUE SHIELD | Admitting: *Deleted

## 2017-01-06 DIAGNOSIS — J309 Allergic rhinitis, unspecified: Secondary | ICD-10-CM | POA: Diagnosis not present

## 2017-01-07 ENCOUNTER — Ambulatory Visit (INDEPENDENT_AMBULATORY_CARE_PROVIDER_SITE_OTHER): Payer: BLUE CROSS/BLUE SHIELD | Admitting: Internal Medicine

## 2017-01-07 ENCOUNTER — Encounter (INDEPENDENT_AMBULATORY_CARE_PROVIDER_SITE_OTHER): Payer: Self-pay

## 2017-01-07 ENCOUNTER — Encounter: Payer: Self-pay | Admitting: Internal Medicine

## 2017-01-07 VITALS — BP 110/70 | HR 80 | Ht 67.32 in | Wt 162.1 lb

## 2017-01-07 DIAGNOSIS — K219 Gastro-esophageal reflux disease without esophagitis: Secondary | ICD-10-CM | POA: Diagnosis not present

## 2017-01-07 DIAGNOSIS — R1310 Dysphagia, unspecified: Secondary | ICD-10-CM | POA: Diagnosis not present

## 2017-01-07 MED ORDER — OMEPRAZOLE 40 MG PO CPDR: 40.0000 mg | DELAYED_RELEASE_CAPSULE | Freq: Every day | ORAL | 11 refills | Status: DC

## 2017-01-07 NOTE — Progress Notes (Signed)
HISTORY OF PRESENT ILLNESS:  Cassandra Murphy is a 41 y.o. female , stay-at-home mother, who is referred by Dr. Rodrigo RanMark Perini regarding a chief complaint of chronic intermittent solid food dysphagia. Patient reports at least 15 year history of this problem. Noticeable with meats. Occurs every several months. Severe episode recently which prompted this appointment. She has had to regurgitate food on occasion for relief. She does describe occasional reflux symptoms for which she will take antacids such as Tums approximately once per month. No other GI complaints. She does tell me that both her parents have had problems requiring repeat esophageal dilation.  REVIEW OF SYSTEMS:  All non-GI ROS negative unless otherwise stated in the history of present illness except for sinus and allergies  Past Medical History:  Diagnosis Date  . Allergic rhinitis   . Asthma   . Hyperlipidemia   . Plantar fasciitis   . Pneumonia   . Shingles     Past Surgical History:  Procedure Laterality Date  . WISDOM TOOTH EXTRACTION      Social History Cassandra BjorkJane W Hefter  reports that she has never smoked. She has never used smokeless tobacco. She reports that she drinks alcohol. She reports that she does not use drugs.  family history includes Heart disease in her maternal grandfather and paternal grandfather; Hyperlipidemia in her father; Ovarian cancer in her paternal grandmother; Pancreatic cancer in her maternal grandfather; Prostate cancer in her maternal grandfather.  Allergies  Allergen Reactions  . Codeine Nausea And Vomiting       PHYSICAL EXAMINATION: Vital signs: BP 110/70 (BP Location: Left Arm, Patient Position: Sitting, Cuff Size: Normal)   Pulse 80   Ht 5' 7.32" (1.71 m) Comment: height measured without shoes  Wt 162 lb 2 oz (73.5 kg)   BMI 25.15 kg/m   Constitutional: generally well-appearing, no acute distress Psychiatric: alert and oriented x3, cooperative Eyes: extraocular movements intact,  anicteric, conjunctiva pink Mouth: oral pharynx moist, no lesions Neck: suppleWithout thyromegaly Lymph: no lymphadenopathy Cardiovascular: heart regular rate and rhythm, no murmur Lungs: clear to auscultation bilaterally Abdomen: soft, nontender, nondistended, no obvious ascites, no peritoneal signs, normal bowel sounds, no organomegaly Rectal:Omitted Extremities: no clubbing cyanosis or lower extremity edema bilaterally Skin: no lesions on visible extremities Neuro: No focal deficits. Cranial nerves intact  ASSESSMENT:  #1. Intermittent solid food dysphagia. Likely peptic stricture or EOE-type esophagus (primary or secondary)  #2. GERD. Intermittent symptoms treated with antacids   PLAN:  #1. Reflux precautions #2. Prescribe omeprazole 40 mg daily. Patient stated that she had her there may be some problems associated with PPIs. She is reluctant but willing if medication is important regarding her medical problem. We discussed this issue with chronic PPI use and some detail #3. Schedule upper endoscopy with esophageal dilation.The nature of the procedure, as well as the risks, benefits, and alternatives were carefully and thoroughly reviewed with the patient. Ample time for discussion and questions allowed. The patient understood, was satisfied, and agreed to proceed.  A copy of this consultation note has been sent to Dr. Waynard EdwardsPerini

## 2017-01-07 NOTE — Patient Instructions (Addendum)
You have been scheduled for an endoscopy. Please follow written instructions given to you at your visit today. If you use inhalers (even only as needed), please bring them with you on the day of your procedure. Your physician has requested that you go to www.startemmi.com and enter the access code given to you at your visit today. This web site gives a general overview about your procedure. However, you should still follow specific instructions given to you by our office regarding your preparation for the procedure.  We have sent the following medications to your pharmacy for you to pick up at your convenience: Omeprazole 40 mg 1 tablet daily  If you are age 865 or older, your body mass index should be between 23-30. Your Body mass index is 25.15 kg/m. If this is out of the aforementioned range listed, please consider follow up with your Primary Care Provider.  If you are age 42 or younger, your body mass index should be between 19-25. Your Body mass index is 25.15 kg/m. If this is out of the aformentioned range listed, please consider follow up with your Primary Care Provider.

## 2017-01-12 ENCOUNTER — Ambulatory Visit (INDEPENDENT_AMBULATORY_CARE_PROVIDER_SITE_OTHER): Payer: BLUE CROSS/BLUE SHIELD | Admitting: *Deleted

## 2017-01-12 DIAGNOSIS — J309 Allergic rhinitis, unspecified: Secondary | ICD-10-CM

## 2017-01-21 ENCOUNTER — Ambulatory Visit (INDEPENDENT_AMBULATORY_CARE_PROVIDER_SITE_OTHER): Payer: BLUE CROSS/BLUE SHIELD

## 2017-01-21 DIAGNOSIS — J309 Allergic rhinitis, unspecified: Secondary | ICD-10-CM | POA: Diagnosis not present

## 2017-01-31 ENCOUNTER — Ambulatory Visit (INDEPENDENT_AMBULATORY_CARE_PROVIDER_SITE_OTHER): Payer: BLUE CROSS/BLUE SHIELD | Admitting: *Deleted

## 2017-01-31 DIAGNOSIS — J309 Allergic rhinitis, unspecified: Secondary | ICD-10-CM

## 2017-02-07 ENCOUNTER — Ambulatory Visit (INDEPENDENT_AMBULATORY_CARE_PROVIDER_SITE_OTHER): Payer: BLUE CROSS/BLUE SHIELD

## 2017-02-07 DIAGNOSIS — J309 Allergic rhinitis, unspecified: Secondary | ICD-10-CM | POA: Diagnosis not present

## 2017-02-11 ENCOUNTER — Encounter: Payer: Self-pay | Admitting: Internal Medicine

## 2017-02-22 ENCOUNTER — Encounter: Payer: BLUE CROSS/BLUE SHIELD | Admitting: Internal Medicine

## 2017-02-22 ENCOUNTER — Ambulatory Visit (INDEPENDENT_AMBULATORY_CARE_PROVIDER_SITE_OTHER): Payer: BLUE CROSS/BLUE SHIELD | Admitting: *Deleted

## 2017-02-22 DIAGNOSIS — J309 Allergic rhinitis, unspecified: Secondary | ICD-10-CM

## 2017-02-22 NOTE — Progress Notes (Signed)
VIALS EXP- 02/25/18 

## 2017-02-25 DIAGNOSIS — J301 Allergic rhinitis due to pollen: Secondary | ICD-10-CM | POA: Diagnosis not present

## 2017-03-03 ENCOUNTER — Ambulatory Visit (INDEPENDENT_AMBULATORY_CARE_PROVIDER_SITE_OTHER): Payer: BLUE CROSS/BLUE SHIELD | Admitting: *Deleted

## 2017-03-03 DIAGNOSIS — J309 Allergic rhinitis, unspecified: Secondary | ICD-10-CM | POA: Diagnosis not present

## 2017-03-15 ENCOUNTER — Ambulatory Visit (INDEPENDENT_AMBULATORY_CARE_PROVIDER_SITE_OTHER): Payer: BLUE CROSS/BLUE SHIELD | Admitting: *Deleted

## 2017-03-15 DIAGNOSIS — J309 Allergic rhinitis, unspecified: Secondary | ICD-10-CM

## 2017-03-23 ENCOUNTER — Ambulatory Visit (INDEPENDENT_AMBULATORY_CARE_PROVIDER_SITE_OTHER): Payer: BLUE CROSS/BLUE SHIELD

## 2017-03-23 DIAGNOSIS — J309 Allergic rhinitis, unspecified: Secondary | ICD-10-CM

## 2017-03-30 ENCOUNTER — Ambulatory Visit (INDEPENDENT_AMBULATORY_CARE_PROVIDER_SITE_OTHER): Payer: BLUE CROSS/BLUE SHIELD | Admitting: *Deleted

## 2017-03-30 DIAGNOSIS — J309 Allergic rhinitis, unspecified: Secondary | ICD-10-CM

## 2017-04-06 ENCOUNTER — Ambulatory Visit (INDEPENDENT_AMBULATORY_CARE_PROVIDER_SITE_OTHER): Payer: BLUE CROSS/BLUE SHIELD

## 2017-04-06 DIAGNOSIS — J309 Allergic rhinitis, unspecified: Secondary | ICD-10-CM | POA: Diagnosis not present

## 2017-04-14 ENCOUNTER — Ambulatory Visit (INDEPENDENT_AMBULATORY_CARE_PROVIDER_SITE_OTHER): Payer: BLUE CROSS/BLUE SHIELD | Admitting: *Deleted

## 2017-04-14 DIAGNOSIS — J309 Allergic rhinitis, unspecified: Secondary | ICD-10-CM | POA: Diagnosis not present

## 2017-04-27 ENCOUNTER — Ambulatory Visit (INDEPENDENT_AMBULATORY_CARE_PROVIDER_SITE_OTHER): Payer: BLUE CROSS/BLUE SHIELD

## 2017-04-27 DIAGNOSIS — J309 Allergic rhinitis, unspecified: Secondary | ICD-10-CM

## 2017-05-13 ENCOUNTER — Ambulatory Visit (INDEPENDENT_AMBULATORY_CARE_PROVIDER_SITE_OTHER): Payer: BLUE CROSS/BLUE SHIELD

## 2017-05-13 DIAGNOSIS — J309 Allergic rhinitis, unspecified: Secondary | ICD-10-CM | POA: Diagnosis not present

## 2017-05-26 ENCOUNTER — Encounter: Payer: Self-pay | Admitting: *Deleted

## 2017-05-26 NOTE — Progress Notes (Signed)
Maintenance vials made  

## 2017-05-27 DIAGNOSIS — J301 Allergic rhinitis due to pollen: Secondary | ICD-10-CM | POA: Diagnosis not present

## 2017-05-31 ENCOUNTER — Ambulatory Visit (INDEPENDENT_AMBULATORY_CARE_PROVIDER_SITE_OTHER): Payer: BLUE CROSS/BLUE SHIELD | Admitting: *Deleted

## 2017-05-31 DIAGNOSIS — J309 Allergic rhinitis, unspecified: Secondary | ICD-10-CM | POA: Diagnosis not present

## 2017-06-13 ENCOUNTER — Ambulatory Visit (INDEPENDENT_AMBULATORY_CARE_PROVIDER_SITE_OTHER): Payer: BLUE CROSS/BLUE SHIELD | Admitting: *Deleted

## 2017-06-13 DIAGNOSIS — J309 Allergic rhinitis, unspecified: Secondary | ICD-10-CM

## 2017-06-28 ENCOUNTER — Ambulatory Visit (INDEPENDENT_AMBULATORY_CARE_PROVIDER_SITE_OTHER): Payer: BLUE CROSS/BLUE SHIELD | Admitting: *Deleted

## 2017-06-28 DIAGNOSIS — J309 Allergic rhinitis, unspecified: Secondary | ICD-10-CM

## 2017-07-12 ENCOUNTER — Ambulatory Visit (INDEPENDENT_AMBULATORY_CARE_PROVIDER_SITE_OTHER): Payer: BLUE CROSS/BLUE SHIELD | Admitting: *Deleted

## 2017-07-12 DIAGNOSIS — J309 Allergic rhinitis, unspecified: Secondary | ICD-10-CM | POA: Diagnosis not present

## 2017-07-26 ENCOUNTER — Ambulatory Visit (INDEPENDENT_AMBULATORY_CARE_PROVIDER_SITE_OTHER): Payer: BLUE CROSS/BLUE SHIELD | Admitting: *Deleted

## 2017-07-26 DIAGNOSIS — F4323 Adjustment disorder with mixed anxiety and depressed mood: Secondary | ICD-10-CM | POA: Diagnosis not present

## 2017-07-26 DIAGNOSIS — J309 Allergic rhinitis, unspecified: Secondary | ICD-10-CM | POA: Diagnosis not present

## 2017-07-26 DIAGNOSIS — R1030 Lower abdominal pain, unspecified: Secondary | ICD-10-CM | POA: Diagnosis not present

## 2017-07-26 DIAGNOSIS — N898 Other specified noninflammatory disorders of vagina: Secondary | ICD-10-CM | POA: Diagnosis not present

## 2017-08-12 ENCOUNTER — Ambulatory Visit (INDEPENDENT_AMBULATORY_CARE_PROVIDER_SITE_OTHER): Payer: BLUE CROSS/BLUE SHIELD

## 2017-08-12 DIAGNOSIS — J309 Allergic rhinitis, unspecified: Secondary | ICD-10-CM

## 2017-08-15 DIAGNOSIS — F4323 Adjustment disorder with mixed anxiety and depressed mood: Secondary | ICD-10-CM | POA: Diagnosis not present

## 2017-08-19 ENCOUNTER — Ambulatory Visit (INDEPENDENT_AMBULATORY_CARE_PROVIDER_SITE_OTHER): Payer: BLUE CROSS/BLUE SHIELD

## 2017-08-19 DIAGNOSIS — J309 Allergic rhinitis, unspecified: Secondary | ICD-10-CM | POA: Diagnosis not present

## 2017-08-26 ENCOUNTER — Ambulatory Visit (INDEPENDENT_AMBULATORY_CARE_PROVIDER_SITE_OTHER): Payer: BLUE CROSS/BLUE SHIELD

## 2017-08-26 DIAGNOSIS — J309 Allergic rhinitis, unspecified: Secondary | ICD-10-CM | POA: Diagnosis not present

## 2017-09-02 ENCOUNTER — Ambulatory Visit (INDEPENDENT_AMBULATORY_CARE_PROVIDER_SITE_OTHER): Payer: BLUE CROSS/BLUE SHIELD

## 2017-09-02 DIAGNOSIS — J309 Allergic rhinitis, unspecified: Secondary | ICD-10-CM

## 2017-09-12 DIAGNOSIS — F4323 Adjustment disorder with mixed anxiety and depressed mood: Secondary | ICD-10-CM | POA: Diagnosis not present

## 2017-09-16 ENCOUNTER — Ambulatory Visit (INDEPENDENT_AMBULATORY_CARE_PROVIDER_SITE_OTHER): Payer: BLUE CROSS/BLUE SHIELD

## 2017-09-16 DIAGNOSIS — J309 Allergic rhinitis, unspecified: Secondary | ICD-10-CM | POA: Diagnosis not present

## 2017-10-03 ENCOUNTER — Ambulatory Visit (INDEPENDENT_AMBULATORY_CARE_PROVIDER_SITE_OTHER): Payer: BLUE CROSS/BLUE SHIELD

## 2017-10-03 DIAGNOSIS — J309 Allergic rhinitis, unspecified: Secondary | ICD-10-CM | POA: Diagnosis not present

## 2017-10-10 DIAGNOSIS — F4323 Adjustment disorder with mixed anxiety and depressed mood: Secondary | ICD-10-CM | POA: Diagnosis not present

## 2017-10-20 ENCOUNTER — Ambulatory Visit (INDEPENDENT_AMBULATORY_CARE_PROVIDER_SITE_OTHER): Payer: BLUE CROSS/BLUE SHIELD | Admitting: *Deleted

## 2017-10-20 DIAGNOSIS — J309 Allergic rhinitis, unspecified: Secondary | ICD-10-CM

## 2017-10-31 ENCOUNTER — Ambulatory Visit (INDEPENDENT_AMBULATORY_CARE_PROVIDER_SITE_OTHER): Payer: BLUE CROSS/BLUE SHIELD | Admitting: *Deleted

## 2017-10-31 DIAGNOSIS — J309 Allergic rhinitis, unspecified: Secondary | ICD-10-CM | POA: Diagnosis not present

## 2017-11-01 NOTE — Progress Notes (Signed)
VIALS EXP 11-03-18 

## 2017-11-07 DIAGNOSIS — J301 Allergic rhinitis due to pollen: Secondary | ICD-10-CM | POA: Diagnosis not present

## 2017-11-14 ENCOUNTER — Ambulatory Visit (INDEPENDENT_AMBULATORY_CARE_PROVIDER_SITE_OTHER): Payer: BLUE CROSS/BLUE SHIELD

## 2017-11-14 DIAGNOSIS — J309 Allergic rhinitis, unspecified: Secondary | ICD-10-CM | POA: Diagnosis not present

## 2017-12-05 ENCOUNTER — Ambulatory Visit (INDEPENDENT_AMBULATORY_CARE_PROVIDER_SITE_OTHER): Payer: BLUE CROSS/BLUE SHIELD | Admitting: *Deleted

## 2017-12-05 DIAGNOSIS — J309 Allergic rhinitis, unspecified: Secondary | ICD-10-CM

## 2017-12-12 ENCOUNTER — Ambulatory Visit (INDEPENDENT_AMBULATORY_CARE_PROVIDER_SITE_OTHER): Payer: BLUE CROSS/BLUE SHIELD | Admitting: *Deleted

## 2017-12-12 DIAGNOSIS — J309 Allergic rhinitis, unspecified: Secondary | ICD-10-CM

## 2017-12-19 ENCOUNTER — Ambulatory Visit (INDEPENDENT_AMBULATORY_CARE_PROVIDER_SITE_OTHER): Payer: BLUE CROSS/BLUE SHIELD | Admitting: *Deleted

## 2017-12-19 DIAGNOSIS — J309 Allergic rhinitis, unspecified: Secondary | ICD-10-CM

## 2017-12-26 ENCOUNTER — Ambulatory Visit (INDEPENDENT_AMBULATORY_CARE_PROVIDER_SITE_OTHER): Payer: BLUE CROSS/BLUE SHIELD | Admitting: *Deleted

## 2017-12-26 DIAGNOSIS — J309 Allergic rhinitis, unspecified: Secondary | ICD-10-CM

## 2018-01-09 ENCOUNTER — Ambulatory Visit (INDEPENDENT_AMBULATORY_CARE_PROVIDER_SITE_OTHER): Payer: BLUE CROSS/BLUE SHIELD

## 2018-01-09 DIAGNOSIS — S60212A Contusion of left wrist, initial encounter: Secondary | ICD-10-CM | POA: Diagnosis not present

## 2018-01-09 DIAGNOSIS — S93492A Sprain of other ligament of left ankle, initial encounter: Secondary | ICD-10-CM | POA: Diagnosis not present

## 2018-01-09 DIAGNOSIS — J309 Allergic rhinitis, unspecified: Secondary | ICD-10-CM | POA: Diagnosis not present

## 2018-01-16 ENCOUNTER — Other Ambulatory Visit: Payer: Self-pay | Admitting: Internal Medicine

## 2018-02-02 ENCOUNTER — Ambulatory Visit (INDEPENDENT_AMBULATORY_CARE_PROVIDER_SITE_OTHER): Payer: BLUE CROSS/BLUE SHIELD

## 2018-02-02 DIAGNOSIS — J309 Allergic rhinitis, unspecified: Secondary | ICD-10-CM

## 2018-02-14 DIAGNOSIS — Z01419 Encounter for gynecological examination (general) (routine) without abnormal findings: Secondary | ICD-10-CM | POA: Diagnosis not present

## 2018-02-14 DIAGNOSIS — Z130 Encounter for screening for diseases of the blood and blood-forming organs and certain disorders involving the immune mechanism: Secondary | ICD-10-CM | POA: Diagnosis not present

## 2018-02-14 DIAGNOSIS — Z6826 Body mass index (BMI) 26.0-26.9, adult: Secondary | ICD-10-CM | POA: Diagnosis not present

## 2018-02-14 DIAGNOSIS — Z1389 Encounter for screening for other disorder: Secondary | ICD-10-CM | POA: Diagnosis not present

## 2018-02-22 ENCOUNTER — Ambulatory Visit (INDEPENDENT_AMBULATORY_CARE_PROVIDER_SITE_OTHER): Payer: BLUE CROSS/BLUE SHIELD

## 2018-02-22 DIAGNOSIS — J309 Allergic rhinitis, unspecified: Secondary | ICD-10-CM

## 2018-03-09 ENCOUNTER — Ambulatory Visit (INDEPENDENT_AMBULATORY_CARE_PROVIDER_SITE_OTHER): Payer: BLUE CROSS/BLUE SHIELD | Admitting: *Deleted

## 2018-03-09 DIAGNOSIS — J309 Allergic rhinitis, unspecified: Secondary | ICD-10-CM | POA: Diagnosis not present

## 2018-03-22 ENCOUNTER — Encounter: Payer: Self-pay | Admitting: *Deleted

## 2018-03-22 NOTE — Progress Notes (Signed)
Vials made. Exp: 03-23-19. hv 

## 2018-03-24 ENCOUNTER — Ambulatory Visit (INDEPENDENT_AMBULATORY_CARE_PROVIDER_SITE_OTHER): Payer: BLUE CROSS/BLUE SHIELD

## 2018-03-24 DIAGNOSIS — J309 Allergic rhinitis, unspecified: Secondary | ICD-10-CM | POA: Diagnosis not present

## 2018-03-27 DIAGNOSIS — J301 Allergic rhinitis due to pollen: Secondary | ICD-10-CM | POA: Diagnosis not present

## 2018-04-07 ENCOUNTER — Ambulatory Visit (INDEPENDENT_AMBULATORY_CARE_PROVIDER_SITE_OTHER): Payer: BLUE CROSS/BLUE SHIELD

## 2018-04-07 DIAGNOSIS — J309 Allergic rhinitis, unspecified: Secondary | ICD-10-CM | POA: Diagnosis not present

## 2018-04-07 DIAGNOSIS — H5213 Myopia, bilateral: Secondary | ICD-10-CM | POA: Diagnosis not present

## 2018-04-07 DIAGNOSIS — H04123 Dry eye syndrome of bilateral lacrimal glands: Secondary | ICD-10-CM | POA: Diagnosis not present

## 2018-05-01 ENCOUNTER — Ambulatory Visit (INDEPENDENT_AMBULATORY_CARE_PROVIDER_SITE_OTHER): Payer: BLUE CROSS/BLUE SHIELD

## 2018-05-01 DIAGNOSIS — J309 Allergic rhinitis, unspecified: Secondary | ICD-10-CM

## 2018-05-08 ENCOUNTER — Ambulatory Visit (INDEPENDENT_AMBULATORY_CARE_PROVIDER_SITE_OTHER): Payer: BLUE CROSS/BLUE SHIELD | Admitting: *Deleted

## 2018-05-08 DIAGNOSIS — J309 Allergic rhinitis, unspecified: Secondary | ICD-10-CM | POA: Diagnosis not present

## 2018-05-15 ENCOUNTER — Ambulatory Visit (INDEPENDENT_AMBULATORY_CARE_PROVIDER_SITE_OTHER): Payer: BLUE CROSS/BLUE SHIELD

## 2018-05-15 DIAGNOSIS — J309 Allergic rhinitis, unspecified: Secondary | ICD-10-CM | POA: Diagnosis not present

## 2018-05-22 ENCOUNTER — Ambulatory Visit (INDEPENDENT_AMBULATORY_CARE_PROVIDER_SITE_OTHER): Payer: BLUE CROSS/BLUE SHIELD | Admitting: *Deleted

## 2018-05-22 DIAGNOSIS — J309 Allergic rhinitis, unspecified: Secondary | ICD-10-CM | POA: Diagnosis not present

## 2018-05-29 ENCOUNTER — Ambulatory Visit (INDEPENDENT_AMBULATORY_CARE_PROVIDER_SITE_OTHER): Payer: BLUE CROSS/BLUE SHIELD

## 2018-05-29 DIAGNOSIS — J309 Allergic rhinitis, unspecified: Secondary | ICD-10-CM

## 2018-06-09 ENCOUNTER — Ambulatory Visit (INDEPENDENT_AMBULATORY_CARE_PROVIDER_SITE_OTHER): Payer: BLUE CROSS/BLUE SHIELD | Admitting: *Deleted

## 2018-06-09 DIAGNOSIS — J309 Allergic rhinitis, unspecified: Secondary | ICD-10-CM

## 2018-06-23 ENCOUNTER — Ambulatory Visit (INDEPENDENT_AMBULATORY_CARE_PROVIDER_SITE_OTHER): Payer: BLUE CROSS/BLUE SHIELD | Admitting: *Deleted

## 2018-06-23 DIAGNOSIS — J309 Allergic rhinitis, unspecified: Secondary | ICD-10-CM

## 2018-07-14 ENCOUNTER — Ambulatory Visit (INDEPENDENT_AMBULATORY_CARE_PROVIDER_SITE_OTHER): Payer: BLUE CROSS/BLUE SHIELD | Admitting: *Deleted

## 2018-07-14 DIAGNOSIS — J309 Allergic rhinitis, unspecified: Secondary | ICD-10-CM

## 2018-07-27 NOTE — Progress Notes (Signed)
Vials exp 08-01-19 

## 2018-07-31 DIAGNOSIS — J301 Allergic rhinitis due to pollen: Secondary | ICD-10-CM | POA: Diagnosis not present

## 2018-08-14 ENCOUNTER — Ambulatory Visit (INDEPENDENT_AMBULATORY_CARE_PROVIDER_SITE_OTHER): Payer: BLUE CROSS/BLUE SHIELD | Admitting: *Deleted

## 2018-08-14 DIAGNOSIS — J309 Allergic rhinitis, unspecified: Secondary | ICD-10-CM | POA: Diagnosis not present

## 2018-09-04 ENCOUNTER — Ambulatory Visit (INDEPENDENT_AMBULATORY_CARE_PROVIDER_SITE_OTHER): Payer: BLUE CROSS/BLUE SHIELD | Admitting: *Deleted

## 2018-09-04 DIAGNOSIS — J309 Allergic rhinitis, unspecified: Secondary | ICD-10-CM

## 2018-09-07 IMAGING — MG 2D DIGITAL DIAGNOSTIC BILATERAL MAMMOGRAM WITH CAD AND ADJUNCT T
9 of 12 series · 9 of 28 positions shown · non-contrast
Comparison: Previous exam(s).

CLINICAL DATA: Followup for probably benign asymmetry seen in the
upper retroareolar right breast.

EXAM:
2D DIGITAL DIAGNOSTIC BILATERAL MAMMOGRAM WITH CAD AND ADJUNCT TOMO

[R CC synth-2D]
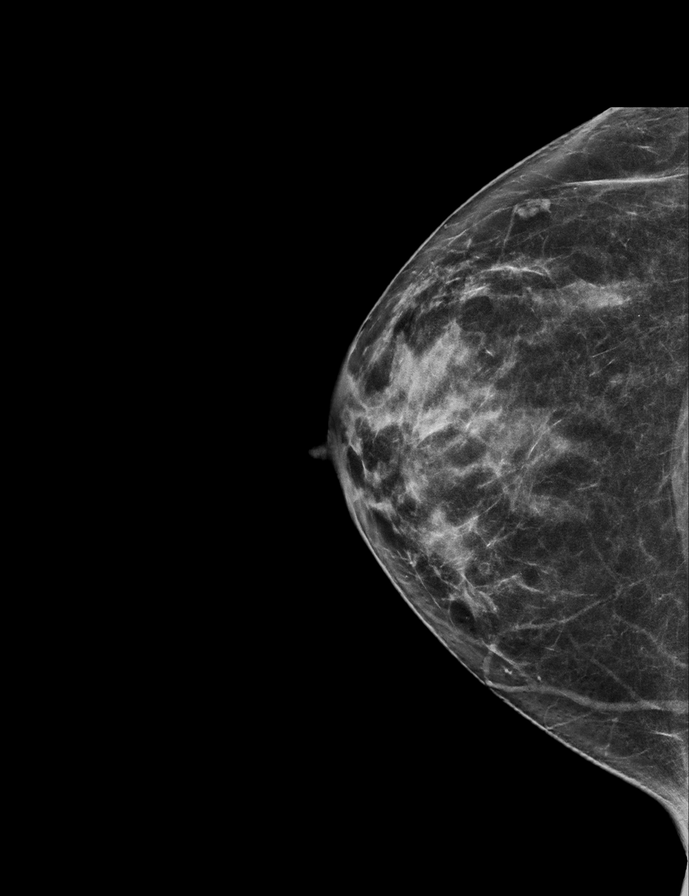

[L CC synth-2D]
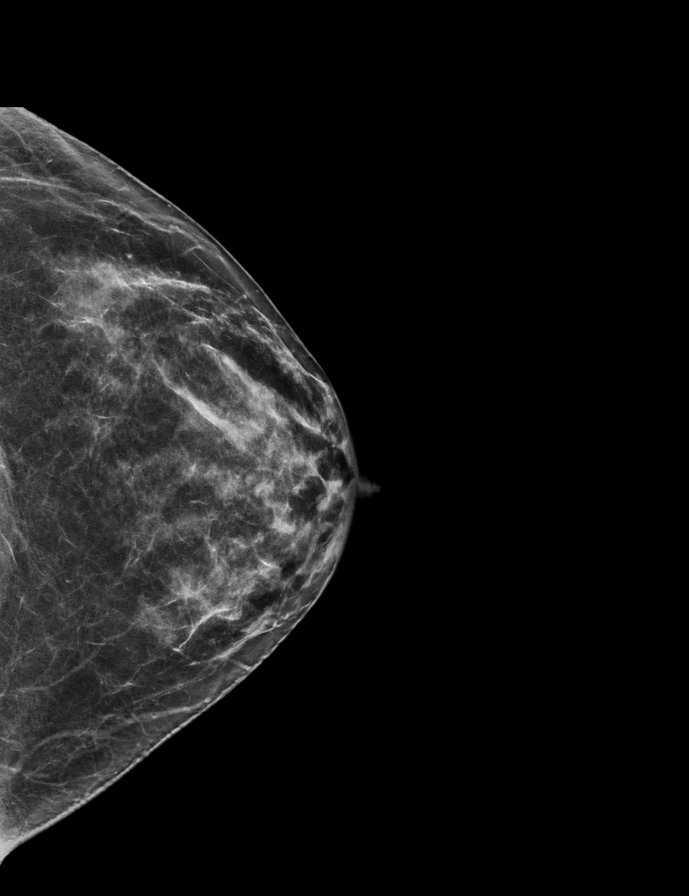

[R MLO]
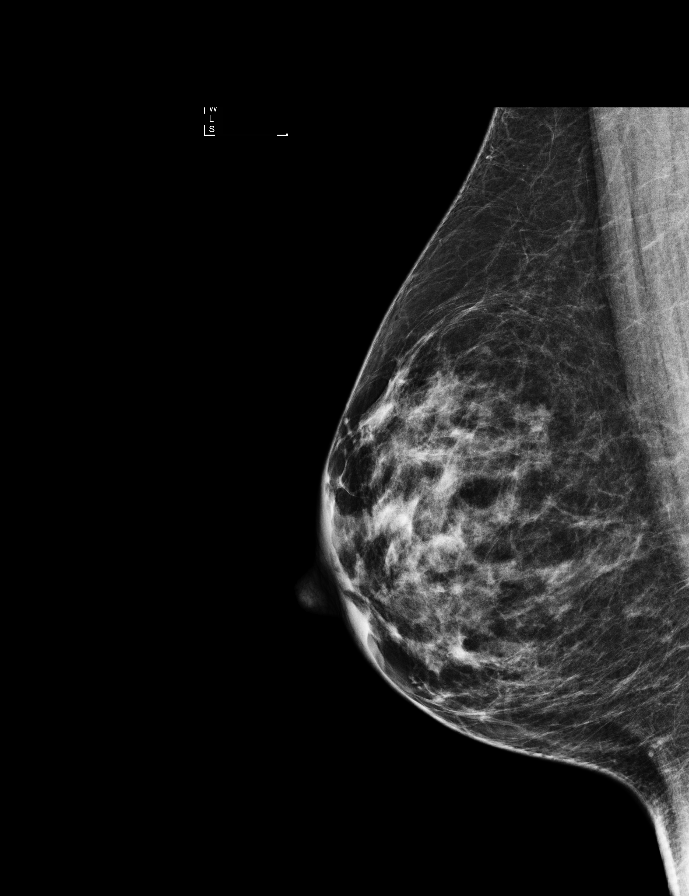

[R MLO synth-2D]
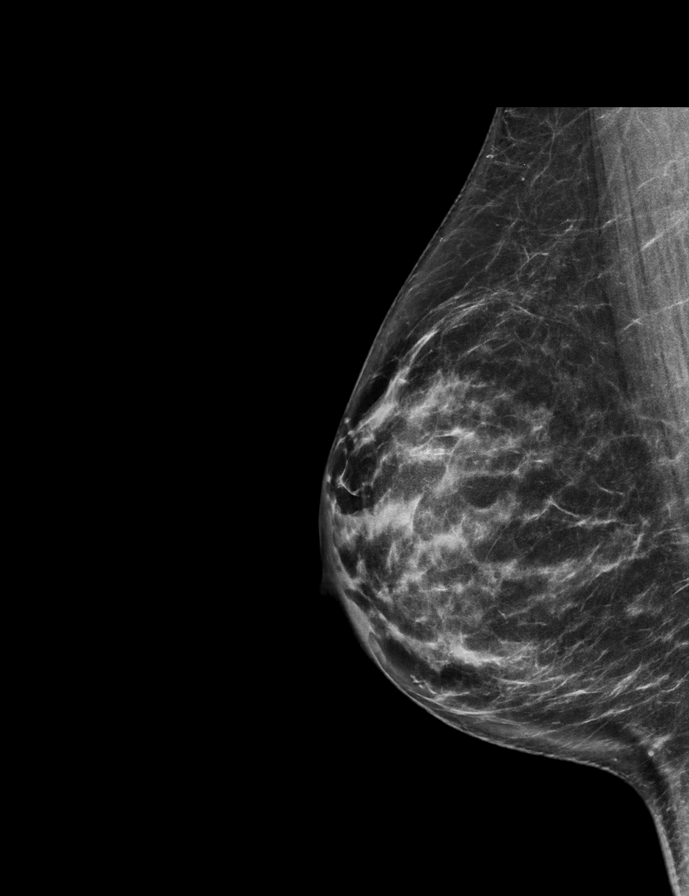

[L MLO synth-2D]
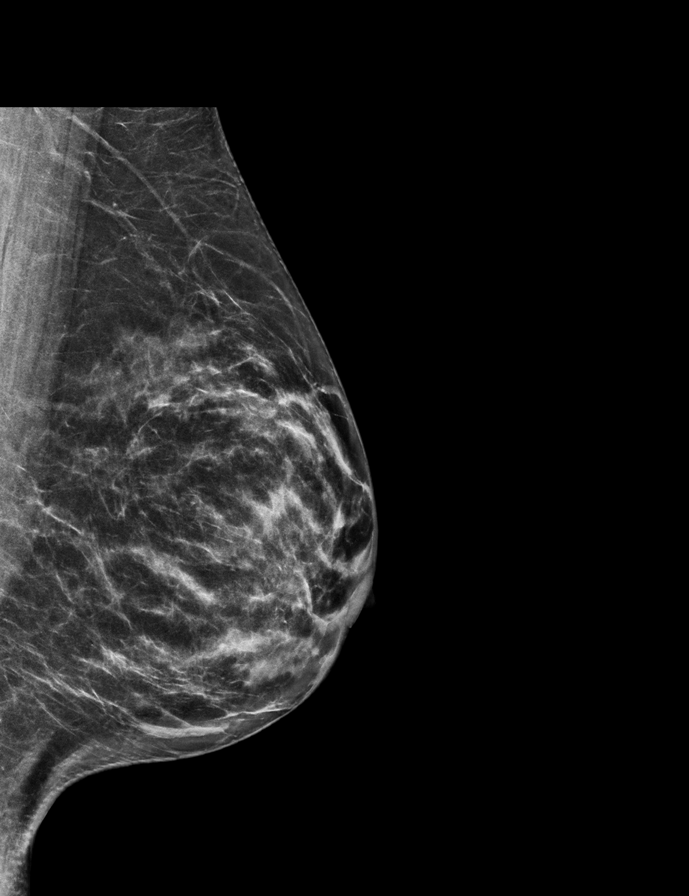

[R CC]
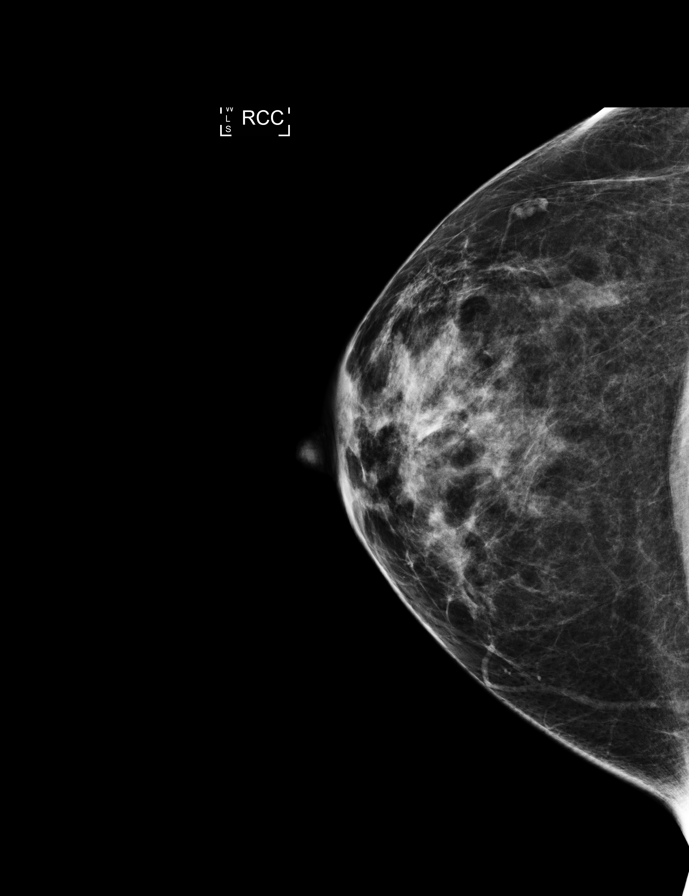

[L MLO]
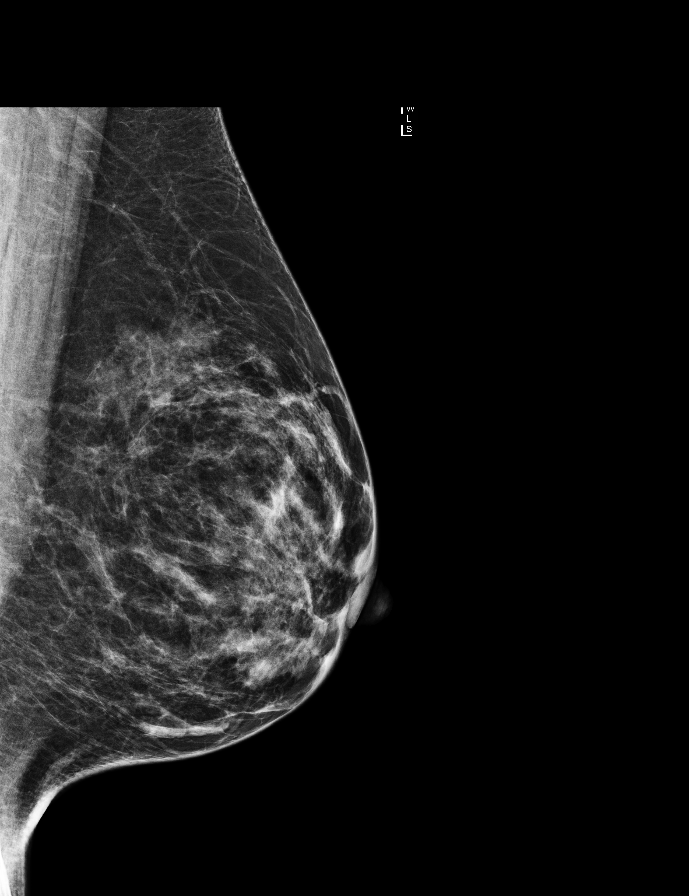

[L CC]
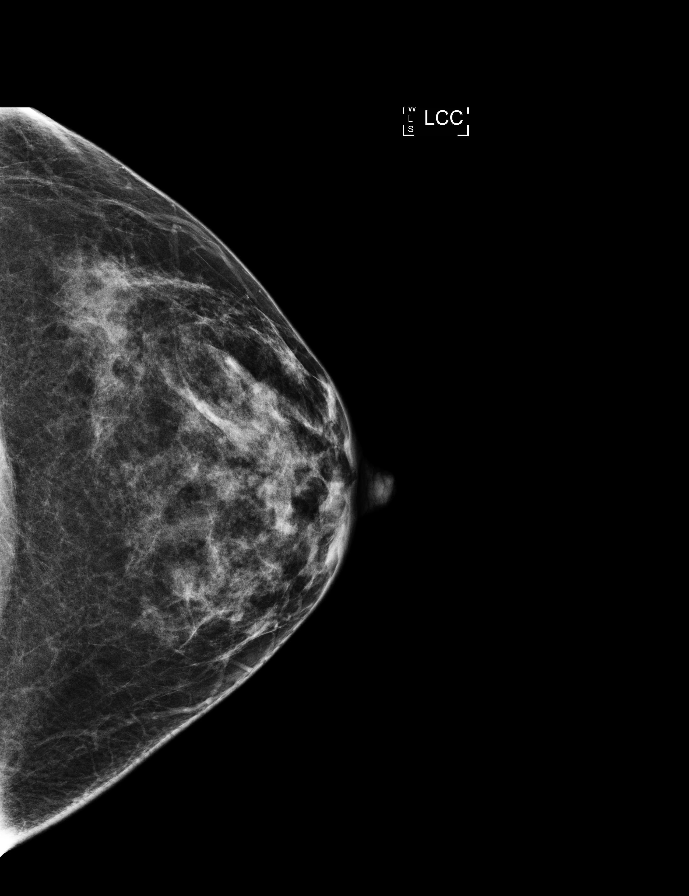

[R CC tomo · tomo slice 33/64.0]
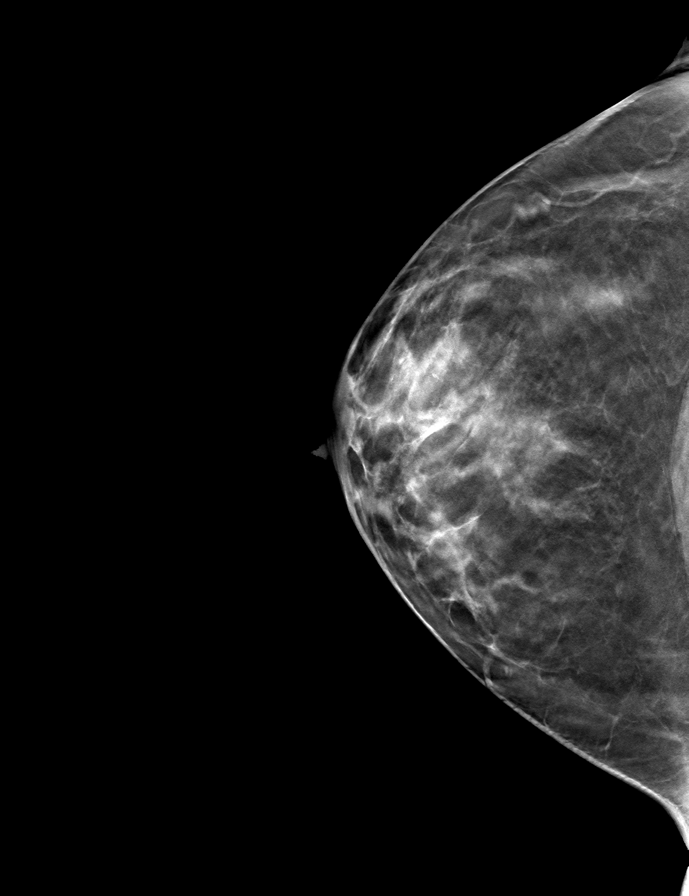

[9 of 28 positions shown; findings below may reference images not displayed]

ACR Breast Density Category c: The breast tissue is heterogeneously
dense, which may obscure small masses.
FINDINGS: No suspicious masses or calcifications are seen in either breast.
The probably benign asymmetry seen in the upper retroareolar right
breast appears unchanged and demonstrates imaging features most
suggestive of normal dense fibroglandular tissue. There is no
mammographic evidence of malignancy in either breast.

Mammographic images were processed with CAD.
IMPRESSION: 1. Unchanged probably benign right breast asymmetry.

2.  No mammographic evidence of malignancy in either breast.

RECOMMENDATION:
Bilateral diagnostic mammography in 12 months which will demonstrate
2 years of stability of the probably benign right breast asymmetry.

I have discussed the findings and recommendations with the patient.
Results were also provided in writing at the conclusion of the
visit. If applicable, a reminder letter will be sent to the patient
regarding the next appointment.

BI-RADS CATEGORY  3: Probably benign.

## 2018-09-12 ENCOUNTER — Telehealth: Payer: Self-pay | Admitting: Internal Medicine

## 2018-09-12 MED ORDER — OMEPRAZOLE 40 MG PO CPDR: DELAYED_RELEASE_CAPSULE | ORAL | 2 refills | Status: DC

## 2018-09-12 NOTE — Telephone Encounter (Signed)
Omeprazole sent 

## 2018-09-12 NOTE — Telephone Encounter (Signed)
Pt sched for OV 3.16.20 with Dr. Marina Goodell.  Pt requested refill on omeprazole.

## 2018-09-25 ENCOUNTER — Ambulatory Visit (INDEPENDENT_AMBULATORY_CARE_PROVIDER_SITE_OTHER): Payer: BLUE CROSS/BLUE SHIELD

## 2018-09-25 DIAGNOSIS — J309 Allergic rhinitis, unspecified: Secondary | ICD-10-CM | POA: Diagnosis not present

## 2018-10-04 ENCOUNTER — Ambulatory Visit (INDEPENDENT_AMBULATORY_CARE_PROVIDER_SITE_OTHER): Payer: BLUE CROSS/BLUE SHIELD

## 2018-10-04 DIAGNOSIS — J309 Allergic rhinitis, unspecified: Secondary | ICD-10-CM | POA: Diagnosis not present

## 2018-10-09 ENCOUNTER — Ambulatory Visit (INDEPENDENT_AMBULATORY_CARE_PROVIDER_SITE_OTHER): Payer: BLUE CROSS/BLUE SHIELD

## 2018-10-09 DIAGNOSIS — J309 Allergic rhinitis, unspecified: Secondary | ICD-10-CM | POA: Diagnosis not present

## 2018-10-17 ENCOUNTER — Ambulatory Visit (INDEPENDENT_AMBULATORY_CARE_PROVIDER_SITE_OTHER): Payer: BLUE CROSS/BLUE SHIELD | Admitting: *Deleted

## 2018-10-17 DIAGNOSIS — J309 Allergic rhinitis, unspecified: Secondary | ICD-10-CM

## 2018-10-23 ENCOUNTER — Ambulatory Visit: Payer: BLUE CROSS/BLUE SHIELD | Admitting: Internal Medicine

## 2018-10-23 ENCOUNTER — Ambulatory Visit (INDEPENDENT_AMBULATORY_CARE_PROVIDER_SITE_OTHER): Payer: BLUE CROSS/BLUE SHIELD | Admitting: *Deleted

## 2018-10-23 DIAGNOSIS — J309 Allergic rhinitis, unspecified: Secondary | ICD-10-CM | POA: Diagnosis not present

## 2018-11-06 ENCOUNTER — Ambulatory Visit (INDEPENDENT_AMBULATORY_CARE_PROVIDER_SITE_OTHER): Payer: BLUE CROSS/BLUE SHIELD | Admitting: *Deleted

## 2018-11-06 DIAGNOSIS — J309 Allergic rhinitis, unspecified: Secondary | ICD-10-CM | POA: Diagnosis not present

## 2018-11-27 ENCOUNTER — Ambulatory Visit (INDEPENDENT_AMBULATORY_CARE_PROVIDER_SITE_OTHER): Payer: BLUE CROSS/BLUE SHIELD

## 2018-11-27 DIAGNOSIS — J309 Allergic rhinitis, unspecified: Secondary | ICD-10-CM | POA: Diagnosis not present

## 2018-11-30 ENCOUNTER — Ambulatory Visit (INDEPENDENT_AMBULATORY_CARE_PROVIDER_SITE_OTHER): Payer: BC Managed Care – PPO | Admitting: Internal Medicine

## 2018-11-30 ENCOUNTER — Other Ambulatory Visit: Payer: Self-pay

## 2018-11-30 ENCOUNTER — Encounter: Payer: Self-pay | Admitting: Internal Medicine

## 2018-11-30 VITALS — Ht 67.5 in | Wt 165.0 lb

## 2018-11-30 DIAGNOSIS — R1310 Dysphagia, unspecified: Secondary | ICD-10-CM

## 2018-11-30 DIAGNOSIS — K219 Gastro-esophageal reflux disease without esophagitis: Secondary | ICD-10-CM | POA: Diagnosis not present

## 2018-12-03 ENCOUNTER — Encounter: Payer: Self-pay | Admitting: Internal Medicine

## 2018-12-03 NOTE — Progress Notes (Signed)
HISTORY OF PRESENT ILLNESS:  Cassandra Murphy is a 43 y.o. female who schedules this telemedicine visit (in the face of coronavirus pandemic) regarding problems with reflux disease and dysphasia.  Patient was actually seen in this office January 07, 2017 regarding the same.  At that time she was advised with regards to reflux precautions, prescribed omeprazole 40 mg daily, and scheduled for upper endoscopy with esophageal dilation.  Patient scheduled but canceled her endoscopy several times.  She has not been seen since.  She tells me that she has continued with intermittent solid food dysphasia.  Rarely has had to induce emesis for relief.  She has been taking the omeprazole very infrequently and only for pyrosis which occurs several times per week.  She denies new problems or issues.  GI review of systems is otherwise negative  REVIEW OF SYSTEMS:  All non-GI ROS negative unless otherwise stated in the HPI except for allergies  Past Medical History:  Diagnosis Date  . Allergic rhinitis   . Asthma   . GERD (gastroesophageal reflux disease)   . Hyperlipidemia   . Plantar fasciitis   . Pneumonia   . Shingles     Past Surgical History:  Procedure Laterality Date  . WISDOM TOOTH EXTRACTION      Social History Cassandra Murphy  reports that she has never smoked. She has never used smokeless tobacco. She reports current alcohol use. She reports that she does not use drugs.  family history includes Heart disease in her maternal grandfather and paternal grandfather; Hyperlipidemia in her father; Ovarian cancer in her paternal grandmother; Pancreatic cancer in her maternal grandfather; Prostate cancer in her maternal grandfather.  Allergies  Allergen Reactions  . Codeine Nausea And Vomiting       PHYSICAL EXAMINATION: No physical exam with telemedicine visit    ASSESSMENT:  1.  Chronic GERD.  Ongoing intermittent symptoms.  Sporadic PPI therapy 2.  Intermittent solid food dysphasia.  Likely  peptic stricture and/or GERD induced EOE type esophagus   PLAN:  1.  Reflux precautions 2.  Prescribe omeprazole 40 mg daily; #30; 11 refills.  Sent to my CMA 3.  Upper endoscopy with esophageal dilation.The nature of the procedure, as well as the risks, benefits, and alternatives were carefully and thoroughly reviewed with the patient. Ample time for discussion and questions allowed. The patient understood, was satisfied, and agreed to proceed.  The patient wishes to call to schedule this appointment at her convenience.  In the interim I have asked her to be extremely careful and chew her food well.  This telemedicine visit was initiated by the patient who consented to the encounter.  She was in her home and I was in my office during this evaluation.  She understands her may be an associated professional charge.

## 2018-12-04 ENCOUNTER — Other Ambulatory Visit: Payer: Self-pay

## 2018-12-04 MED ORDER — OMEPRAZOLE 40 MG PO CPDR: DELAYED_RELEASE_CAPSULE | ORAL | 11 refills | Status: DC

## 2018-12-19 ENCOUNTER — Ambulatory Visit (INDEPENDENT_AMBULATORY_CARE_PROVIDER_SITE_OTHER): Payer: BLUE CROSS/BLUE SHIELD

## 2018-12-19 DIAGNOSIS — J309 Allergic rhinitis, unspecified: Secondary | ICD-10-CM

## 2018-12-20 NOTE — Progress Notes (Signed)
VIALS EXP 12-20-2019 

## 2018-12-21 DIAGNOSIS — J301 Allergic rhinitis due to pollen: Secondary | ICD-10-CM | POA: Diagnosis not present

## 2019-01-08 ENCOUNTER — Ambulatory Visit (INDEPENDENT_AMBULATORY_CARE_PROVIDER_SITE_OTHER): Payer: BLUE CROSS/BLUE SHIELD

## 2019-01-08 DIAGNOSIS — J309 Allergic rhinitis, unspecified: Secondary | ICD-10-CM

## 2019-02-07 ENCOUNTER — Ambulatory Visit (INDEPENDENT_AMBULATORY_CARE_PROVIDER_SITE_OTHER): Payer: BLUE CROSS/BLUE SHIELD | Admitting: *Deleted

## 2019-02-07 DIAGNOSIS — J309 Allergic rhinitis, unspecified: Secondary | ICD-10-CM | POA: Diagnosis not present

## 2019-02-24 ENCOUNTER — Other Ambulatory Visit: Payer: Self-pay | Admitting: Internal Medicine

## 2019-02-24 DIAGNOSIS — Z20822 Contact with and (suspected) exposure to covid-19: Secondary | ICD-10-CM

## 2019-02-28 ENCOUNTER — Ambulatory Visit (INDEPENDENT_AMBULATORY_CARE_PROVIDER_SITE_OTHER): Payer: BLUE CROSS/BLUE SHIELD | Admitting: *Deleted

## 2019-02-28 DIAGNOSIS — J309 Allergic rhinitis, unspecified: Secondary | ICD-10-CM

## 2019-02-28 LAB — NOVEL CORONAVIRUS, NAA: SARS-CoV-2, NAA: NOT DETECTED

## 2019-04-02 ENCOUNTER — Ambulatory Visit (INDEPENDENT_AMBULATORY_CARE_PROVIDER_SITE_OTHER): Payer: BLUE CROSS/BLUE SHIELD | Admitting: *Deleted

## 2019-04-02 DIAGNOSIS — J309 Allergic rhinitis, unspecified: Secondary | ICD-10-CM | POA: Diagnosis not present

## 2019-04-10 ENCOUNTER — Ambulatory Visit (INDEPENDENT_AMBULATORY_CARE_PROVIDER_SITE_OTHER): Payer: BLUE CROSS/BLUE SHIELD | Admitting: *Deleted

## 2019-04-10 DIAGNOSIS — J309 Allergic rhinitis, unspecified: Secondary | ICD-10-CM | POA: Diagnosis not present

## 2019-04-18 ENCOUNTER — Ambulatory Visit (INDEPENDENT_AMBULATORY_CARE_PROVIDER_SITE_OTHER): Payer: BLUE CROSS/BLUE SHIELD | Admitting: *Deleted

## 2019-04-18 DIAGNOSIS — J309 Allergic rhinitis, unspecified: Secondary | ICD-10-CM

## 2019-04-26 ENCOUNTER — Ambulatory Visit (INDEPENDENT_AMBULATORY_CARE_PROVIDER_SITE_OTHER): Payer: BLUE CROSS/BLUE SHIELD

## 2019-04-26 DIAGNOSIS — J309 Allergic rhinitis, unspecified: Secondary | ICD-10-CM | POA: Diagnosis not present

## 2019-04-27 DIAGNOSIS — H5213 Myopia, bilateral: Secondary | ICD-10-CM | POA: Diagnosis not present

## 2019-04-27 DIAGNOSIS — H04123 Dry eye syndrome of bilateral lacrimal glands: Secondary | ICD-10-CM | POA: Diagnosis not present

## 2019-05-03 ENCOUNTER — Ambulatory Visit (INDEPENDENT_AMBULATORY_CARE_PROVIDER_SITE_OTHER): Payer: BLUE CROSS/BLUE SHIELD

## 2019-05-03 DIAGNOSIS — J309 Allergic rhinitis, unspecified: Secondary | ICD-10-CM

## 2019-06-01 ENCOUNTER — Ambulatory Visit (INDEPENDENT_AMBULATORY_CARE_PROVIDER_SITE_OTHER): Payer: BLUE CROSS/BLUE SHIELD

## 2019-06-01 DIAGNOSIS — J309 Allergic rhinitis, unspecified: Secondary | ICD-10-CM

## 2019-06-05 ENCOUNTER — Other Ambulatory Visit: Payer: Self-pay

## 2019-06-05 ENCOUNTER — Encounter: Payer: Self-pay | Admitting: Sports Medicine

## 2019-06-05 ENCOUNTER — Ambulatory Visit: Payer: BC Managed Care – PPO | Admitting: Sports Medicine

## 2019-06-05 ENCOUNTER — Ambulatory Visit: Payer: Self-pay

## 2019-06-05 VITALS — BP 112/78 | Ht 68.0 in | Wt 170.0 lb

## 2019-06-05 DIAGNOSIS — M25562 Pain in left knee: Secondary | ICD-10-CM

## 2019-06-05 DIAGNOSIS — S86012A Strain of left Achilles tendon, initial encounter: Secondary | ICD-10-CM

## 2019-06-05 DIAGNOSIS — M25561 Pain in right knee: Secondary | ICD-10-CM | POA: Diagnosis not present

## 2019-06-05 DIAGNOSIS — M79672 Pain in left foot: Secondary | ICD-10-CM

## 2019-06-05 NOTE — Progress Notes (Signed)
PCP: Crist Infante, MD  Subjective:   HPI: Patient is a 43 y.o. female here for evaluation of left ankle pain and bilateral knee pain.  Patient notes her left ankle pain started approximately 1 month ago.  It started after playing tennis.  She denies any specific injury or trauma.  Pain is located on the posterior aspect of her ankle near the Achilles tendon insertion.  Pain does not radiate.  She denies any bruising or swelling.  She has no numbness or tingling in her foot.  Patient is taken last 2 weeks off of tennis to see if this would improve her pain however she notes that her pain is still present.  Patient has not taken any medications for the pain.  She notes the pain is a sharp stabbing quality to it.  The pain is worse with activity.  Patient also notes bilateral knee swelling and popping.  She notes the pain in her knees is minimal however the swelling bothers her.  She denies any bruising of her knee.  She denies any specific injury or trauma.  Patient does note that she was given orthotics to her in the past which she stopped wearing for several months but resumed wearing them in August.  The pain in her knee does not radiate.  She denies any locking or catching of her knee.    Review of Systems: See HPI above.  Past Medical History:  Diagnosis Date  . Allergic rhinitis   . Asthma   . GERD (gastroesophageal reflux disease)   . Hyperlipidemia   . Plantar fasciitis   . Pneumonia   . Shingles     Current Outpatient Medications on File Prior to Visit  Medication Sig Dispense Refill  . albuterol (PROAIR HFA) 108 (90 Base) MCG/ACT inhaler Inhale 2 puffs into the lungs every 6 (six) hours as needed for wheezing or shortness of breath.    Marland Kitchen ibuprofen (ADVIL,MOTRIN) 200 MG tablet Take 200 mg by mouth as needed.     Marland Kitchen omeprazole (PRILOSEC) 40 MG capsule TAKE 1 CAPSULE(40 MG) BY MOUTH DAILY 30 capsule 11   No current facility-administered medications on file prior to visit.      Past Surgical History:  Procedure Laterality Date  . WISDOM TOOTH EXTRACTION      Allergies  Allergen Reactions  . Codeine Nausea And Vomiting    Social History   Socioeconomic History  . Marital status: Married    Spouse name: Not on file  . Number of children: 2  . Years of education: Not on file  . Highest education level: Not on file  Occupational History  . Occupation: home maker  Social Needs  . Financial resource strain: Not on file  . Food insecurity    Worry: Not on file    Inability: Not on file  . Transportation needs    Medical: Not on file    Non-medical: Not on file  Tobacco Use  . Smoking status: Never Smoker  . Smokeless tobacco: Never Used  Substance and Sexual Activity  . Alcohol use: Yes    Alcohol/week: 0.0 standard drinks    Comment: 0-1 per day  . Drug use: No  . Sexual activity: Not on file  Lifestyle  . Physical activity    Days per week: Not on file    Minutes per session: Not on file  . Stress: Not on file  Relationships  . Social connections    Talks on phone: Not on file  Gets together: Not on file    Attends religious service: Not on file    Active member of club or organization: Not on file    Attends meetings of clubs or organizations: Not on file    Relationship status: Not on file  . Intimate partner violence    Fear of current or ex partner: Not on file    Emotionally abused: Not on file    Physically abused: Not on file    Forced sexual activity: Not on file  Other Topics Concern  . Not on file  Social History Narrative  . Not on file    Family History  Problem Relation Age of Onset  . Hyperlipidemia Father   . Pancreatic cancer Maternal Grandfather   . Prostate cancer Maternal Grandfather   . Heart disease Maternal Grandfather   . Ovarian cancer Paternal Grandmother   . Heart disease Paternal Grandfather   . Allergic rhinitis Neg Hx   . Angioedema Neg Hx   . Asthma Neg Hx   . Eczema Neg Hx   .  Immunodeficiency Neg Hx   . Urticaria Neg Hx         Objective:  Physical Exam: Ht 5\' 8"  (1.727 m)   BMI 25.09 kg/m  Gen: NAD, comfortable in exam room Lungs: Breathing comfortably on room air Knee Exam Bilateral -Inspection: no deformity, no discoloration -Palpation: No tenderness palpation -ROM: Extension: -10 degrees; Flexion: 150 degrees -Strength: Extension: 5/5; Flexion: 5/5. Hip flexion: 5/5, hip abduction 4/5 -Special Tests: Varus Stress: Negative; Valgus Stress: Negative; Lachman: Negative; Posterior drawer: Negative; McMurray: Negative; Thessaly: Negative; Patellar grind: Negative -Limb neurovascularly intact, no instability noted  Ankle/Foot Exam Left -Inspection: Flattening of the longitudinal transverse arches with significant splaying of the first and second toes -Palpation: No tenderness palpation -ROM: Normal ROM with dorsiflexion, plantarflexion, inversion, eversion -Strength: Dorsiflexion: 5/5; Plantarflexion: 5/5; Inversion: 5/5; Eversion: 5/5 -Special Tests: Anterior drawer: negative; Talar tilt: Negative; Calcaneal squeeze: Negative; Tib/fib: Negative -Limb neurovascularly intact, no instability noted  Contralateral Ankle -Inspection: Flattening of the longitudinal transverse arches with significant splaying of the first and second toes -Palpation: Medial malleolus: non-tender; lateral malleolus: non-tender; 5th metatarsal: non-tender; calcaneus: non-tender; plantar fascia insertion: non-tender -ROM: Normal ROM with dorsiflexion, plantarflexion, inversion, eversion -Strength: Dorsiflexion: 5/5; Plantarflexion: 5/5; Inversion: 5/5; Eversion: 5/5 -Limb neurovascularly intact, no instability noted  -Gait: Normal   Limited diagnostic ultrasound of bilateral knees Findings: -No joint effusion noted in the suprapatellar pouch bilaterally. -Normal appearance of the quadriceps tendon -Normal appearance of both medial lateral joint lines bilaterally.   Normal-appearing meniscus.  No degenerative changes noted Impression: -Normal knee ultrasound examination  Limited diagnostic ultrasound of the left ankle Findings: -Normal appearance of the Achilles tendon.  No thickening noted.  No hypoechoic changes noted.  No tears noted -Normal appearance of the plantar fascia.  No thickening noted.  No hypoechoic changes.  No tears Impression: -Normal appearance of the Achilles tendon and plantar fascia  Impression: normal ultrasound screen of both knees and left ankle  Ultrasound and interpretation by Dr. and Lyn Hollingshead. Gantt, MD    Assessment & Plan:  Patient is a 43 y.o. female here for evaluation of left ankle pain and bilateral knee pain  1.  Bilateral knee pain secondary to hip abduction weakness -Patient given hip abduction strengthening exercises including hip abduction, hip external rotation, sidestep, crossover step up exercises.  Patient to do 3 sets of 15 for each exercise -Patient was given quad strengthening exercises -  Patient to resume wearing orthotics given to her at her previous visit as the significant splaying of her first and second toes as well as the flattening of her longitudinal transverse arches are pending increase pressure to her knees -Patient was advised that she may continue playing tennis  2. Achilles tendon strain -Normal appearance on ultrasound.  No tears or thickening noted -Patient given eccentric strengthening exercises for her Achilles tendon -Patient cleared to play tennis as tolerated  Patient to follow-up in 6 weeks as needed

## 2019-06-05 NOTE — Patient Instructions (Signed)
Your ankle and foot pain is caused by a strain of your Achilles tendon -Work on the strengthening exercises shown to you at today's visit for your Achilles -You may play tennis as your risk of injuring this further is low  Your knee pain is caused by combination of weakness in your hip abductors and flexors -Work on the exercises shown to you at today's visit including hip abduction, hip external rotation, sidesteps, crossover sidesteps, knee extension -Do these exercises using ankle weights.  Do 3 sets of 15  We will see you back in 6 weeks if your pain is not improved

## 2019-06-07 ENCOUNTER — Ambulatory Visit: Payer: BLUE CROSS/BLUE SHIELD | Admitting: Sports Medicine

## 2019-06-15 DIAGNOSIS — M25561 Pain in right knee: Secondary | ICD-10-CM | POA: Diagnosis not present

## 2019-06-15 DIAGNOSIS — M25562 Pain in left knee: Secondary | ICD-10-CM | POA: Diagnosis not present

## 2019-06-15 DIAGNOSIS — M25572 Pain in left ankle and joints of left foot: Secondary | ICD-10-CM | POA: Diagnosis not present

## 2019-06-29 ENCOUNTER — Ambulatory Visit (INDEPENDENT_AMBULATORY_CARE_PROVIDER_SITE_OTHER): Payer: BLUE CROSS/BLUE SHIELD

## 2019-06-29 DIAGNOSIS — J309 Allergic rhinitis, unspecified: Secondary | ICD-10-CM

## 2019-07-11 DIAGNOSIS — M25562 Pain in left knee: Secondary | ICD-10-CM | POA: Diagnosis not present

## 2019-07-26 DIAGNOSIS — Z20828 Contact with and (suspected) exposure to other viral communicable diseases: Secondary | ICD-10-CM | POA: Diagnosis not present

## 2019-07-26 DIAGNOSIS — Z7189 Other specified counseling: Secondary | ICD-10-CM | POA: Diagnosis not present

## 2019-07-27 ENCOUNTER — Ambulatory Visit (INDEPENDENT_AMBULATORY_CARE_PROVIDER_SITE_OTHER): Payer: BLUE CROSS/BLUE SHIELD | Admitting: *Deleted

## 2019-07-27 DIAGNOSIS — J309 Allergic rhinitis, unspecified: Secondary | ICD-10-CM

## 2019-07-28 DIAGNOSIS — Z03818 Encounter for observation for suspected exposure to other biological agents ruled out: Secondary | ICD-10-CM | POA: Diagnosis not present

## 2019-08-15 DIAGNOSIS — M25562 Pain in left knee: Secondary | ICD-10-CM | POA: Diagnosis not present

## 2019-08-20 ENCOUNTER — Other Ambulatory Visit: Payer: Self-pay | Admitting: Obstetrics and Gynecology

## 2019-08-20 DIAGNOSIS — N6489 Other specified disorders of breast: Secondary | ICD-10-CM

## 2019-08-20 DIAGNOSIS — M25562 Pain in left knee: Secondary | ICD-10-CM | POA: Diagnosis not present

## 2019-08-21 DIAGNOSIS — M6281 Muscle weakness (generalized): Secondary | ICD-10-CM | POA: Diagnosis not present

## 2019-08-21 DIAGNOSIS — M25562 Pain in left knee: Secondary | ICD-10-CM | POA: Diagnosis not present

## 2019-08-21 DIAGNOSIS — M222X2 Patellofemoral disorders, left knee: Secondary | ICD-10-CM | POA: Diagnosis not present

## 2019-08-23 ENCOUNTER — Ambulatory Visit (INDEPENDENT_AMBULATORY_CARE_PROVIDER_SITE_OTHER): Payer: BLUE CROSS/BLUE SHIELD

## 2019-08-23 DIAGNOSIS — J309 Allergic rhinitis, unspecified: Secondary | ICD-10-CM | POA: Diagnosis not present

## 2019-08-30 DIAGNOSIS — M6281 Muscle weakness (generalized): Secondary | ICD-10-CM | POA: Diagnosis not present

## 2019-08-30 DIAGNOSIS — M25562 Pain in left knee: Secondary | ICD-10-CM | POA: Diagnosis not present

## 2019-08-30 DIAGNOSIS — M222X2 Patellofemoral disorders, left knee: Secondary | ICD-10-CM | POA: Diagnosis not present

## 2019-08-31 ENCOUNTER — Other Ambulatory Visit: Payer: Self-pay

## 2019-08-31 ENCOUNTER — Ambulatory Visit
Admission: RE | Admit: 2019-08-31 | Discharge: 2019-08-31 | Disposition: A | Discharge status: Home / Self Care | Payer: BC Managed Care – PPO | Source: Ambulatory Visit | Attending: Obstetrics and Gynecology | Admitting: Obstetrics and Gynecology

## 2019-08-31 DIAGNOSIS — R922 Inconclusive mammogram: Secondary | ICD-10-CM | POA: Diagnosis not present

## 2019-08-31 DIAGNOSIS — N6489 Other specified disorders of breast: Secondary | ICD-10-CM

## 2019-09-15 DIAGNOSIS — Z23 Encounter for immunization: Secondary | ICD-10-CM | POA: Diagnosis not present

## 2019-09-20 ENCOUNTER — Ambulatory Visit (INDEPENDENT_AMBULATORY_CARE_PROVIDER_SITE_OTHER): Payer: BLUE CROSS/BLUE SHIELD

## 2019-09-20 DIAGNOSIS — J309 Allergic rhinitis, unspecified: Secondary | ICD-10-CM

## 2019-10-02 NOTE — Progress Notes (Signed)
Vials exp 10-01-20 

## 2019-10-03 DIAGNOSIS — J301 Allergic rhinitis due to pollen: Secondary | ICD-10-CM | POA: Diagnosis not present

## 2019-10-04 DIAGNOSIS — S93491A Sprain of other ligament of right ankle, initial encounter: Secondary | ICD-10-CM | POA: Diagnosis not present

## 2019-10-13 DIAGNOSIS — Z23 Encounter for immunization: Secondary | ICD-10-CM | POA: Diagnosis not present

## 2019-10-25 ENCOUNTER — Ambulatory Visit (INDEPENDENT_AMBULATORY_CARE_PROVIDER_SITE_OTHER): Payer: BC Managed Care – PPO

## 2019-10-25 DIAGNOSIS — J309 Allergic rhinitis, unspecified: Secondary | ICD-10-CM | POA: Diagnosis not present

## 2019-11-16 ENCOUNTER — Ambulatory Visit (INDEPENDENT_AMBULATORY_CARE_PROVIDER_SITE_OTHER): Payer: Self-pay

## 2019-11-16 DIAGNOSIS — J309 Allergic rhinitis, unspecified: Secondary | ICD-10-CM

## 2019-11-19 ENCOUNTER — Telehealth: Payer: Self-pay | Admitting: Allergy & Immunology

## 2019-11-19 NOTE — Telephone Encounter (Signed)
Left voicemail to schedule yearly appointment for insurance purposes regarding injections.

## 2019-11-26 ENCOUNTER — Ambulatory Visit (INDEPENDENT_AMBULATORY_CARE_PROVIDER_SITE_OTHER): Payer: BLUE CROSS/BLUE SHIELD

## 2019-11-26 DIAGNOSIS — J309 Allergic rhinitis, unspecified: Secondary | ICD-10-CM | POA: Diagnosis not present

## 2019-12-03 ENCOUNTER — Ambulatory Visit (INDEPENDENT_AMBULATORY_CARE_PROVIDER_SITE_OTHER): Payer: BLUE CROSS/BLUE SHIELD

## 2019-12-03 DIAGNOSIS — J309 Allergic rhinitis, unspecified: Secondary | ICD-10-CM

## 2019-12-10 ENCOUNTER — Ambulatory Visit (INDEPENDENT_AMBULATORY_CARE_PROVIDER_SITE_OTHER): Payer: BLUE CROSS/BLUE SHIELD

## 2019-12-10 DIAGNOSIS — J309 Allergic rhinitis, unspecified: Secondary | ICD-10-CM

## 2019-12-11 ENCOUNTER — Encounter: Payer: Self-pay | Admitting: Allergy & Immunology

## 2019-12-11 ENCOUNTER — Ambulatory Visit: Payer: BLUE CROSS/BLUE SHIELD | Admitting: Allergy & Immunology

## 2019-12-11 ENCOUNTER — Other Ambulatory Visit: Payer: Self-pay

## 2019-12-11 VITALS — BP 120/78 | HR 76 | Temp 98.0°F | Resp 18 | Ht 68.0 in | Wt 183.2 lb

## 2019-12-11 DIAGNOSIS — N764 Abscess of vulva: Secondary | ICD-10-CM | POA: Insufficient documentation

## 2019-12-11 DIAGNOSIS — K219 Gastro-esophageal reflux disease without esophagitis: Secondary | ICD-10-CM

## 2019-12-11 DIAGNOSIS — L508 Other urticaria: Secondary | ICD-10-CM | POA: Diagnosis not present

## 2019-12-11 DIAGNOSIS — J4520 Mild intermittent asthma, uncomplicated: Secondary | ICD-10-CM

## 2019-12-11 DIAGNOSIS — J302 Other seasonal allergic rhinitis: Secondary | ICD-10-CM | POA: Insufficient documentation

## 2019-12-11 DIAGNOSIS — J3089 Other allergic rhinitis: Secondary | ICD-10-CM | POA: Diagnosis not present

## 2019-12-11 MED ORDER — OMEPRAZOLE 40 MG PO CPDR: DELAYED_RELEASE_CAPSULE | ORAL | 11 refills | Status: DC

## 2019-12-11 MED ORDER — EPINEPHRINE 0.3 MG/0.3ML IJ SOAJ: 0.3000 mg | INTRAMUSCULAR | 2 refills | Status: DC | PRN

## 2019-12-11 MED ORDER — ALBUTEROL SULFATE HFA 108 (90 BASE) MCG/ACT IN AERS: 2.0000 | INHALATION_SPRAY | RESPIRATORY_TRACT | 1 refills | Status: DC | PRN

## 2019-12-11 NOTE — Patient Instructions (Addendum)
1. Seasonal and perennial allergic rhinitis - Continue with allergy shots at the same schedule.   - We will anticipate doing five years total (February 2023).  - Continue with cetirizine 10mg .  2. Chronic urticaria - Continue with cetirizine 10mg  daily. - You can always trial off of them.   3. Mild intermittent asthma, uncomplicated - Lung testing deferred.  - Continue with albuterol as needed.  4. GERD - Continue with omeprazole 40mg  daily.   5. Return in about 1 year (around 12/10/2020). This can be an in-person, a virtual Webex or a telephone follow up visit.   Please inform of any Emergency Department visits, hospitalizations, or changes in symptoms. Call before going to the ED for breathing or allergy symptoms since we might be able to fit you in for a sick visit. Feel free to contact 02/09/2021 anytime with any questions, problems, or concerns.  It was a pleasure to see you again today!  Websites that have reliable patient information: 1. American Academy of Asthma, Allergy, and Immunology: www.aaaai.org 2. Food Allergy Research and Education (FARE): foodallergy.org 3. Mothers of Asthmatics: http://www.asthmacommunitynetwork.org 4. American College of Allergy, Asthma, and Immunology: www.acaai.org   COVID-19 Vaccine Information can be found at: [place] For questions related to vaccine distribution or appointments, please email [email or web address] or call [phone].     "Like" [place] on Facebook and Instagram for our latest updates!       HAPPY SPRING!  Make sure you are registered to vote! If you have moved or changed any of your contact information, you will need to get this updated before voting!  In some cases, you MAY be able to register to vote online: [email or web address]

## 2019-12-11 NOTE — Progress Notes (Signed)
FOLLOW UP  Date of Service/Encounter:  12/11/19   Assessment:   Seasonal and perennial allergic rhinitis  Chronic urticaria - controlled with daily cetirizine  Mild intermittent asthma, uncomplicated  GERD   Plan/Recommendations:   1. Seasonal and perennial allergic rhinitis - Continue with allergy shots at the same schedule.   - We will anticipate doing five years total (February 2023).  - Continue with cetirizine 10mg .  2. Chronic urticaria - Continue with cetirizine 10mg  daily. - You can always trial off of them.   3. Mild intermittent asthma, uncomplicated - Lung testing deferred.  - Continue with albuterol as needed.  4. GERD - Continue with omeprazole 40mg  daily.   5. Return in about 1 year (around 12/10/2020). This can be an in-person, a virtual Webex or a telephone follow up visit.  Subjective:   Munira Polson is a 44 y.o. female presenting today for follow up of No chief complaint on file.   Ciarra Braddy Heiser has a history of the following: Patient Active Problem List   Diagnosis Date Noted  . Furuncle of vulva 12/11/2019  . Sinus pressure 11/25/2015  . Unstable ankle 11/10/2015  . Ehlers-Danlos syndrome, type 3 11/10/2015    History obtained from: chart review and patient.  Racquel is a 44 y.o. female presenting for a follow up visit.  She was last seen in October 2017.  At that time, she did make the decision to start allergen immunotherapy.  For her asthma, we continued with Singulair and ProAir.   Brooklin is on allergen immunotherapy. She receives two injections. Immunotherapy script #1 contains dust mites. She currently receives 0.58mL of the RED vial (1/100). Immunotherapy script #2 contains trees, weeds and grasses. She currently receives 0.54mL of the RED vial (1/100). She started shots November of 2017 and reached maintenance in February of 2018.  She tells me that the shots have helped. She did have some days for 4-5 days with sneezing over  the spring, but this typically has helped a lot. Before she started shots, she was having issues 4-5 days every week or so, therefore she has definitely improved. She is on cetirizine daily, but she mostly takes it for hives that started last spring.  She has omeprazole daily for GERD. She uses almond milk but does not use a calcium supplement. She does not use a multivitamin as well.   Otherwise, there have been no changes to her past medical history, surgical history, family history, or social history.    Review of Systems  Constitutional: Negative.  Negative for fever, malaise/fatigue and weight loss.  HENT: Negative.  Negative for congestion, ear discharge and ear pain.   Eyes: Negative for pain, discharge and redness.  Respiratory: Negative for cough, sputum production, shortness of breath and wheezing.   Cardiovascular: Negative.  Negative for chest pain and palpitations.  Gastrointestinal: Negative for abdominal pain, constipation, diarrhea, heartburn, nausea and vomiting.  Skin: Negative.  Negative for itching and rash.  Neurological: Negative for dizziness and headaches.  Endo/Heme/Allergies: Negative for environmental allergies. Does not bruise/bleed easily.       Objective:   Blood pressure 120/78, pulse 76, temperature 98 F (36.7 C), temperature source Temporal, resp. rate 18, height 5\' 8"  (1.727 m), weight 183 lb 3.2 oz (83.1 kg), SpO2 96 %. Body mass index is 27.86 kg/m.   Physical Exam:  Physical Exam  Constitutional: She appears well-developed.  Pleasant female.  HENT:  Head: Normocephalic and atraumatic.  Right Ear: Tympanic membrane,  external ear and ear canal normal.  Left Ear: Tympanic membrane and ear canal normal.  Nose: No mucosal edema, rhinorrhea, nasal deformity or septal deviation. No epistaxis. Right sinus exhibits no maxillary sinus tenderness and no frontal sinus tenderness. Left sinus exhibits no maxillary sinus tenderness and no frontal sinus  tenderness.  Mouth/Throat: Uvula is midline and oropharynx is clear and moist. Mucous membranes are not pale and not dry.  Eyes: Pupils are equal, round, and reactive to light. Conjunctivae and EOM are normal. Right eye exhibits no chemosis and no discharge. Left eye exhibits no chemosis and no discharge. Right conjunctiva is not injected. Left conjunctiva is not injected.  Cardiovascular: Normal rate, regular rhythm and normal heart sounds.  Respiratory: Effort normal and breath sounds normal. No accessory muscle usage. No tachypnea. No respiratory distress. She has no wheezes. She has no rhonchi. She has no rales. She exhibits no tenderness.  Lymphadenopathy:    She has no cervical adenopathy.  Neurological: She is alert.  Skin: No abrasion, no petechiae and no rash noted. Rash is not papular, not vesicular and not urticarial. No erythema. No pallor.  Psychiatric: She has a normal mood and affect.     Diagnostic studies: none    Malachi Bonds, MD  [facility]

## 2019-12-17 ENCOUNTER — Ambulatory Visit (INDEPENDENT_AMBULATORY_CARE_PROVIDER_SITE_OTHER): Payer: BLUE CROSS/BLUE SHIELD

## 2019-12-17 DIAGNOSIS — J302 Other seasonal allergic rhinitis: Secondary | ICD-10-CM | POA: Diagnosis not present

## 2019-12-17 DIAGNOSIS — J3089 Other allergic rhinitis: Secondary | ICD-10-CM

## 2020-01-23 ENCOUNTER — Ambulatory Visit (INDEPENDENT_AMBULATORY_CARE_PROVIDER_SITE_OTHER): Payer: BLUE CROSS/BLUE SHIELD | Admitting: *Deleted

## 2020-01-23 DIAGNOSIS — J309 Allergic rhinitis, unspecified: Secondary | ICD-10-CM

## 2020-02-25 ENCOUNTER — Ambulatory Visit (INDEPENDENT_AMBULATORY_CARE_PROVIDER_SITE_OTHER): Payer: BLUE CROSS/BLUE SHIELD

## 2020-02-25 DIAGNOSIS — J309 Allergic rhinitis, unspecified: Secondary | ICD-10-CM

## 2020-04-07 ENCOUNTER — Ambulatory Visit (INDEPENDENT_AMBULATORY_CARE_PROVIDER_SITE_OTHER): Payer: BLUE CROSS/BLUE SHIELD

## 2020-04-07 DIAGNOSIS — J309 Allergic rhinitis, unspecified: Secondary | ICD-10-CM | POA: Diagnosis not present

## 2020-05-02 DIAGNOSIS — H5213 Myopia, bilateral: Secondary | ICD-10-CM | POA: Diagnosis not present

## 2020-05-02 DIAGNOSIS — H04123 Dry eye syndrome of bilateral lacrimal glands: Secondary | ICD-10-CM | POA: Diagnosis not present

## 2020-05-05 ENCOUNTER — Ambulatory Visit (INDEPENDENT_AMBULATORY_CARE_PROVIDER_SITE_OTHER): Payer: BLUE CROSS/BLUE SHIELD

## 2020-05-05 DIAGNOSIS — J309 Allergic rhinitis, unspecified: Secondary | ICD-10-CM | POA: Diagnosis not present

## 2020-06-16 ENCOUNTER — Ambulatory Visit (INDEPENDENT_AMBULATORY_CARE_PROVIDER_SITE_OTHER): Payer: BLUE CROSS/BLUE SHIELD | Admitting: *Deleted

## 2020-06-16 DIAGNOSIS — J309 Allergic rhinitis, unspecified: Secondary | ICD-10-CM | POA: Diagnosis not present

## 2020-06-24 DIAGNOSIS — J301 Allergic rhinitis due to pollen: Secondary | ICD-10-CM | POA: Diagnosis not present

## 2020-06-24 NOTE — Progress Notes (Signed)
Vials exp 06-24-21 

## 2020-07-14 ENCOUNTER — Ambulatory Visit (INDEPENDENT_AMBULATORY_CARE_PROVIDER_SITE_OTHER): Payer: BLUE CROSS/BLUE SHIELD

## 2020-07-14 DIAGNOSIS — J309 Allergic rhinitis, unspecified: Secondary | ICD-10-CM | POA: Diagnosis not present

## 2020-08-15 ENCOUNTER — Ambulatory Visit (INDEPENDENT_AMBULATORY_CARE_PROVIDER_SITE_OTHER): Payer: BLUE CROSS/BLUE SHIELD

## 2020-08-15 DIAGNOSIS — J309 Allergic rhinitis, unspecified: Secondary | ICD-10-CM

## 2020-08-22 ENCOUNTER — Ambulatory Visit (INDEPENDENT_AMBULATORY_CARE_PROVIDER_SITE_OTHER): Payer: BLUE CROSS/BLUE SHIELD | Admitting: *Deleted

## 2020-08-22 DIAGNOSIS — J309 Allergic rhinitis, unspecified: Secondary | ICD-10-CM | POA: Diagnosis not present

## 2020-08-29 ENCOUNTER — Ambulatory Visit (INDEPENDENT_AMBULATORY_CARE_PROVIDER_SITE_OTHER): Payer: BLUE CROSS/BLUE SHIELD | Admitting: *Deleted

## 2020-08-29 DIAGNOSIS — J309 Allergic rhinitis, unspecified: Secondary | ICD-10-CM

## 2020-09-05 ENCOUNTER — Ambulatory Visit (INDEPENDENT_AMBULATORY_CARE_PROVIDER_SITE_OTHER): Payer: BLUE CROSS/BLUE SHIELD

## 2020-09-05 DIAGNOSIS — J309 Allergic rhinitis, unspecified: Secondary | ICD-10-CM | POA: Diagnosis not present

## 2020-09-12 ENCOUNTER — Ambulatory Visit (INDEPENDENT_AMBULATORY_CARE_PROVIDER_SITE_OTHER): Payer: BLUE CROSS/BLUE SHIELD

## 2020-09-12 DIAGNOSIS — J309 Allergic rhinitis, unspecified: Secondary | ICD-10-CM

## 2020-09-28 DIAGNOSIS — Z20822 Contact with and (suspected) exposure to covid-19: Secondary | ICD-10-CM | POA: Diagnosis not present

## 2020-10-14 ENCOUNTER — Ambulatory Visit (INDEPENDENT_AMBULATORY_CARE_PROVIDER_SITE_OTHER): Payer: BLUE CROSS/BLUE SHIELD | Admitting: *Deleted

## 2020-10-14 DIAGNOSIS — J309 Allergic rhinitis, unspecified: Secondary | ICD-10-CM | POA: Diagnosis not present

## 2020-11-07 ENCOUNTER — Encounter: Payer: Self-pay | Admitting: Internal Medicine

## 2020-12-03 ENCOUNTER — Ambulatory Visit (INDEPENDENT_AMBULATORY_CARE_PROVIDER_SITE_OTHER): Payer: BLUE CROSS/BLUE SHIELD

## 2020-12-03 DIAGNOSIS — J309 Allergic rhinitis, unspecified: Secondary | ICD-10-CM | POA: Diagnosis not present

## 2020-12-12 ENCOUNTER — Ambulatory Visit (INDEPENDENT_AMBULATORY_CARE_PROVIDER_SITE_OTHER): Payer: BC Managed Care – PPO

## 2020-12-12 DIAGNOSIS — J309 Allergic rhinitis, unspecified: Secondary | ICD-10-CM | POA: Diagnosis not present

## 2020-12-16 ENCOUNTER — Encounter: Payer: Self-pay | Admitting: Allergy & Immunology

## 2020-12-16 ENCOUNTER — Ambulatory Visit: Payer: BC Managed Care – PPO | Admitting: Allergy & Immunology

## 2020-12-16 ENCOUNTER — Other Ambulatory Visit: Payer: Self-pay

## 2020-12-16 VITALS — BP 122/80 | HR 71 | Temp 98.2°F | Resp 18 | Ht 68.0 in | Wt 186.8 lb

## 2020-12-16 DIAGNOSIS — J4520 Mild intermittent asthma, uncomplicated: Secondary | ICD-10-CM | POA: Diagnosis not present

## 2020-12-16 DIAGNOSIS — K219 Gastro-esophageal reflux disease without esophagitis: Secondary | ICD-10-CM

## 2020-12-16 DIAGNOSIS — J3089 Other allergic rhinitis: Secondary | ICD-10-CM

## 2020-12-16 DIAGNOSIS — L508 Other urticaria: Secondary | ICD-10-CM

## 2020-12-16 DIAGNOSIS — J302 Other seasonal allergic rhinitis: Secondary | ICD-10-CM

## 2020-12-16 MED ORDER — CETIRIZINE HCL 10 MG PO TABS: 10.0000 mg | ORAL_TABLET | Freq: Every day | ORAL | 1 refills | Status: DC

## 2020-12-16 MED ORDER — EPINEPHRINE 0.3 MG/0.3ML IJ SOAJ
0.3000 mg | INTRAMUSCULAR | 1 refills | Status: DC | PRN
Start: 2020-12-16 — End: 2024-04-10

## 2020-12-16 MED ORDER — ALBUTEROL SULFATE HFA 108 (90 BASE) MCG/ACT IN AERS: 2.0000 | INHALATION_SPRAY | RESPIRATORY_TRACT | 1 refills | Status: DC | PRN

## 2020-12-16 MED ORDER — PANTOPRAZOLE SODIUM 40 MG PO TBEC: 40.0000 mg | DELAYED_RELEASE_TABLET | Freq: Every day | ORAL | 3 refills | Status: DC

## 2020-12-16 NOTE — Progress Notes (Signed)
FOLLOW UP  Date of Service/Encounter:  12/16/20   Assessment:   Seasonal and perennial allergic rhinitis  Chronic urticaria - controlled with daily cetirizine  Mild intermittent asthma, uncomplicated  GERD   Plan/Recommendations:   1. Seasonal and perennial allergic rhinitis - Continue with allergy shots at the same schedule.   - We will anticipate doing five years total (February 2023).  - Continue with cetirizine 10mg . - EpiPen refilled.   2. Chronic urticaria - resolved  - Continue with cetirizine 10mg  daily as needed.   3. Mild intermittent asthma, uncomplicated - Lung testing deferred.  - Continue with albuterol as needed.  4. GERD - Change to pantoprazole 40mg  daily.   5. Return in about 1 year (around 12/16/2021).    Subjective:   Cassandra Murphy is a 45 y.o. female presenting today for follow up of  Chief Complaint  Patient presents with  . Allergic Rhinitis     No issues allergy shots help   . Asthma    No flares uses inhaler a couple times a month for minor symptoms, but not daily     Cassandra Murphy has a history of the following: Patient Active Problem List   Diagnosis Date Noted  . Furuncle of vulva 12/11/2019  . Gastroesophageal reflux disease 12/11/2019  . Mild intermittent asthma, uncomplicated 12/11/2019  . Seasonal and perennial allergic rhinitis 12/11/2019  . Chronic urticaria 12/11/2019  . Sinus pressure 11/25/2015  . Unstable ankle 11/10/2015  . Ehlers-Danlos syndrome, type 3 11/10/2015    History obtained from: chart review and patient.  Cassandra Murphy is a 45 y.o. female presenting for a follow up visit. She was last seen in May 2021. At that time, we continued with allergy shots at the same schedule. We also continued with cetirizine 10mg  daily. Urticaria was controlled with the use of cetirizine. Asthma was controlled with albuterol as needed. GERD was controlled with the use of omeprazole 40mg  daily.   Since the last visit,  she has done very well.  She is very happy with our allergy shots that helped her.  Asthma/Respiratory Symptom History: Her asthma is all but nonexistent.  She does have an albuterol to use as needed. Cassandra Murphy's asthma has been well controlled. She has not required rescue medication, experienced nocturnal awakenings due to lower respiratory symptoms, nor have activities of daily living been limited. She has required no Emergency Department or Urgent Care visits for her asthma. She has required zero courses of systemic steroids for asthma exacerbations since the last visit. ACT score today is 25, indicating excellent asthma symptom control.   She remains on her omeprazole for her GERD.  She is wondering if there is something else she can take because her insurance is no longer covering omeprazole very well.  She has never been on pantoprazole or Dexilant.  Allergic Rhinitis Symptom History: She has nose sprays when she has flare ups. She does have issues when she is in dusty environments. She has not needed antibitoics.   Cassandra Murphy is on allergen immunotherapy. She receives two injections. Immunotherapy script #1 contains dust mites. She currently receives 0.47mL of the RED vial (1/100). Immunotherapy script #2 contains trees, weeds and grasses. She currently receives 0.80mL of the RED vial (1/100). She started shots November of 2017 and reached maintenance in February of 2018.   Hives are largely absent. She was on cetirizine daily for a while to suppress them. They have eventually resolved. She has not had an outbreak in several  months.  Work is going well.  She remains very busy.  She works for [employer] for an [employer].  She is going to [place] and [place] this summer for 3 weeks with her family.  Otherwise, there have been no changes to her past medical history, surgical history, family history, or social history.    Review of Systems  Constitutional: Negative.  Negative for fever,  malaise/fatigue and weight loss.  HENT: Negative.  Negative for congestion, ear discharge and ear pain.   Eyes: Negative for pain, discharge and redness.  Respiratory: Negative for cough, sputum production, shortness of breath and wheezing.   Cardiovascular: Negative.  Negative for chest pain and palpitations.  Gastrointestinal: Negative for abdominal pain and heartburn.  Skin: Negative.  Negative for itching and rash.  Neurological: Negative for dizziness and headaches.  Endo/Heme/Allergies: Negative for environmental allergies. Does not bruise/bleed easily.       Objective:   Blood pressure 122/80, pulse 71, temperature 98.2 F (36.8 C), resp. rate 18, height 5\' 8"  (1.727 m), weight 186 lb 12.8 oz (84.7 kg), SpO2 97 %. Body mass index is 28.4 kg/m.   Physical Exam:  Physical Exam Constitutional:      Appearance: She is well-developed.     Comments: Very pleasant.  HENT:     Head: Normocephalic and atraumatic.     Right Ear: Tympanic membrane, ear canal and external ear normal.     Left Ear: Tympanic membrane, ear canal and external ear normal.     Nose: No nasal deformity, septal deviation, mucosal edema or rhinorrhea.     Right Turbinates: Enlarged and swollen.     Left Turbinates: Enlarged and swollen.     Right Sinus: No maxillary sinus tenderness or frontal sinus tenderness.     Left Sinus: No maxillary sinus tenderness or frontal sinus tenderness.     Comments: Nasal polyps absent.    Mouth/Throat:     Mouth: Mucous membranes are not pale and not dry.     Pharynx: Uvula midline.  Eyes:     General:        Right eye: No discharge.        Left eye: No discharge.     Conjunctiva/sclera: Conjunctivae normal.     Right eye: Right conjunctiva is not injected. No chemosis.    Left eye: Left conjunctiva is not injected. No chemosis.    Pupils: Pupils are equal, round, and reactive to light.  Cardiovascular:     Rate and Rhythm: Normal rate and regular rhythm.      Heart sounds: Normal heart sounds.  Pulmonary:     Effort: Pulmonary effort is normal. No tachypnea, accessory muscle usage or respiratory distress.     Breath sounds: Normal breath sounds. No wheezing, rhonchi or rales.     Comments: Moving air well in all lung Schey.  No increased work of breathing. Chest:     Chest wall: No tenderness.  Lymphadenopathy:     Cervical: No cervical adenopathy.  Skin:    General: Skin is warm.     Capillary Refill: Capillary refill takes less than 2 seconds.     Coloration: Skin is not pale.     Findings: No abrasion, erythema, petechiae or rash. Rash is not papular, urticarial or vesicular.     Comments: No eczematous or urticarial lesions noted.  Neurological:     Mental Status: She is alert.  Psychiatric:        Behavior: Behavior is cooperative.  Diagnostic studies: none       Malachi Bonds, MD  [facility]

## 2020-12-16 NOTE — Patient Instructions (Addendum)
1. Seasonal and perennial allergic rhinitis - Continue with allergy shots at the same schedule.   - We will anticipate doing five years total (February 2023).  - Continue with cetirizine 10mg . - EpiPen refilled.   2. Chronic urticaria - resolved  - Continue with cetirizine 10mg  daily as needed.   3. Mild intermittent asthma, uncomplicated - Lung testing deferred.  - Continue with albuterol as needed.  4. GERD - Change to pantoprazole 40mg  daily.   5. Return in about 1 year (around 12/16/2021).    Please inform of any Emergency Department visits, hospitalizations, or changes in symptoms. Call before going to the ED for breathing or allergy symptoms since we might be able to fit you in for a sick visit. Feel free to contact 02/15/2022 anytime with any questions, problems, or concerns.  It was a pleasure to see you again today!  Websites that have reliable patient information: 1. American Academy of Asthma, Allergy, and Immunology: www.aaaai.org 2. Food Allergy Research and Education (FARE): foodallergy.org 3. Mothers of Asthmatics: http://www.asthmacommunitynetwork.org 4. American College of Allergy, Asthma, and Immunology: www.acaai.org   COVID-19 Vaccine Information can be found at: [place] For questions related to vaccine distribution or appointments, please email [email or web address] or call [phone].   We realize that you might be concerned about having an allergic reaction to the COVID19 vaccines. To help with that concern, WE ARE OFFERING THE COVID19 VACCINES IN OUR OFFICE! Ask the front desk for dates!     "Like" [place] on Facebook and Instagram for our latest updates!      A healthy democracy works best when [email or web address] participate! Make sure you are registered to vote! If you have moved or changed any of your contact information, you will need to get this updated before voting!  In some cases, you  MAY be able to register to vote online: [phone]

## 2020-12-23 ENCOUNTER — Encounter: Payer: Self-pay | Admitting: Podiatry

## 2020-12-23 ENCOUNTER — Ambulatory Visit: Payer: BC Managed Care – PPO | Admitting: Podiatry

## 2020-12-23 ENCOUNTER — Other Ambulatory Visit: Payer: Self-pay

## 2020-12-23 ENCOUNTER — Ambulatory Visit (INDEPENDENT_AMBULATORY_CARE_PROVIDER_SITE_OTHER): Payer: BC Managed Care – PPO

## 2020-12-23 DIAGNOSIS — M7662 Achilles tendinitis, left leg: Secondary | ICD-10-CM

## 2020-12-23 DIAGNOSIS — M79672 Pain in left foot: Secondary | ICD-10-CM

## 2020-12-23 DIAGNOSIS — G8929 Other chronic pain: Secondary | ICD-10-CM

## 2020-12-23 MED ORDER — MELOXICAM 15 MG PO TABS: 15.0000 mg | ORAL_TABLET | Freq: Every day | ORAL | 0 refills | Status: AC

## 2020-12-23 NOTE — Patient Instructions (Signed)

## 2020-12-25 ENCOUNTER — Other Ambulatory Visit: Payer: Self-pay | Admitting: Allergy & Immunology

## 2020-12-28 NOTE — Progress Notes (Signed)
Subjective:   Patient ID: Cassandra Murphy, female   DOB: 45 y.o.   MRN: [id number]   HPI 45 year old female presents the office today for concerns of left heel pain.  She says is sharp, stabbing pain in the back of her heel.  She denies any recent injury or trauma to her foot.  No swelling that she reports.  Is been ongoing for last couple months.  No other concerns.   Review of Systems  All other systems reviewed and are negative.  Past Medical History:  Diagnosis Date  . Allergic rhinitis   . Asthma   . GERD (gastroesophageal reflux disease)   . Hyperlipidemia   . Plantar fasciitis   . Pneumonia   . Shingles     Past Surgical History:  Procedure Laterality Date  . WISDOM TOOTH EXTRACTION       Current Outpatient Medications:  .  meloxicam (MOBIC) 15 MG tablet, Take 1 tablet (15 mg total) by mouth daily., Disp: 30 tablet, Rfl: 0 .  montelukast (SINGULAIR) 10 MG tablet, Take 1 tablet by mouth at bedtime., Disp: , Rfl:  .  albuterol (PROAIR HFA) 108 (90 Base) MCG/ACT inhaler, Inhale 2 puffs into the lungs every 4 (four) hours as needed for wheezing or shortness of breath., Disp: 18 g, Rfl: 1 .  cetirizine (ZYRTEC) 10 MG tablet, Take 10 mg by mouth daily., Disp: , Rfl:  .  cetirizine (ZYRTEC) 10 MG tablet, Take 1 tablet (10 mg total) by mouth daily., Disp: 90 tablet, Rfl: 1 .  EPINEPHrine 0.3 mg/0.3 mL IJ SOAJ injection, Inject 0.3 mg into the muscle as needed for anaphylaxis., Disp: 2 each, Rfl: 1 .  ibuprofen (ADVIL,MOTRIN) 200 MG tablet, Take 200 mg by mouth as needed. , Disp: , Rfl:  .  levonorgestrel (MIRENA, 52 MG,) 20 MCG/24HR IUD, Mirena 20 mcg/24 hours (6 yrs) 52 mg intrauterine device  Take 1 device by intrauterine route., Disp: , Rfl:  .  omeprazole (PRILOSEC) 40 MG capsule, TAKE 1 CAPSULE(40 MG) BY MOUTH DAILY, Disp: 30 capsule, Rfl: 11 .  pantoprazole (PROTONIX) 40 MG tablet, Take 1 tablet (40 mg total) by mouth daily., Disp: 90 tablet, Rfl: 3  Allergies   Allergen Reactions  . Codeine Nausea And Vomiting        Objective:  Physical Exam  General: AAO x3, NAD  Dermatological: Skin is warm, dry and supple bilateral. There are no open sores, no preulcerative lesions, no rash or signs of infection present.  Vascular: Dorsalis Pedis artery and Posterior Tibial artery pedal pulses are 2/4 bilateral with immedate capillary fill time.  There is no pain with calf compression, swelling, warmth, erythema.   Neruologic: Grossly intact via light touch bilateral.  Negative Tinel's sign.  Musculoskeletal: There is mild tenderness palpation on the distal portion of the calcaneus at the insertion of the Achilles tendon.  Achilles tendon appears to be intact.  No edema, erythema.  No pain with lateral compression of calcaneus.  No significant discomfort of the plantar aspect calcaneus at insertion of plantar fascia.  No area of pinpoint tenderness identified otherwise.  Muscular strength 5/5 in all groups tested bilateral.  Gait: Unassisted, Nonantalgic.       Assessment:   45 year old female with left-sided Achilles tendinitis     Plan:  -Treatment options discussed including all alternatives, risks, and complications -Etiology of symptoms were discussed -X-rays were obtained and reviewed with the patient.  Inferior calcaneal spurs present.  No significant posterior spurring. -Discussed  stretching, icing daily.  Heel lift.  Prescribe meloxicam to use as needed.  Continue with supportive shoes, inserts.  Vivi Barrack DPM

## 2021-01-09 ENCOUNTER — Ambulatory Visit (INDEPENDENT_AMBULATORY_CARE_PROVIDER_SITE_OTHER): Payer: BC Managed Care – PPO

## 2021-01-09 DIAGNOSIS — J309 Allergic rhinitis, unspecified: Secondary | ICD-10-CM | POA: Diagnosis not present

## 2021-02-04 ENCOUNTER — Ambulatory Visit (AMBULATORY_SURGERY_CENTER): Payer: BC Managed Care – PPO | Admitting: *Deleted

## 2021-02-04 ENCOUNTER — Other Ambulatory Visit: Payer: Self-pay

## 2021-02-04 VITALS — Ht 68.0 in

## 2021-02-04 DIAGNOSIS — Z1211 Encounter for screening for malignant neoplasm of colon: Secondary | ICD-10-CM

## 2021-02-04 MED ORDER — PEG-KCL-NACL-NASULF-NA ASC-C 100 G PO SOLR: 1.0000 | Freq: Once | ORAL | 0 refills | Status: AC

## 2021-02-04 NOTE — Progress Notes (Signed)
Virtual pre visit completed. Instructions sent through secure e-mail [email or web address]  No egg or soy allergy known to patient  No issues with past sedation with any surgeries or procedures Patient denies ever being told they had issues or difficulty with intubation  No FH of Malignant Hyperthermia No diet pills per patient No home 02 use per patient  No blood thinners per patient  Pt denies issues with constipation  No A fib or A flutter  EMMI video to pt or via MyChart  COVID 19 guidelines implemented in [place] today with Pt and RN  Pt is fully vaccinated  for Covid    Discussed with pt there will be an out-of-pocket cost for prep and that varies from $0 to 70 dollars   Due to the COVID-19 pandemic we are asking patients to follow certain guidelines.  Pt aware of COVID protocols and [facility] guidelines

## 2021-02-16 ENCOUNTER — Ambulatory Visit (INDEPENDENT_AMBULATORY_CARE_PROVIDER_SITE_OTHER): Payer: BC Managed Care – PPO

## 2021-02-16 DIAGNOSIS — J309 Allergic rhinitis, unspecified: Secondary | ICD-10-CM | POA: Diagnosis not present

## 2021-02-17 ENCOUNTER — Telehealth: Payer: Self-pay | Admitting: Internal Medicine

## 2021-02-17 DIAGNOSIS — J3089 Other allergic rhinitis: Secondary | ICD-10-CM | POA: Diagnosis not present

## 2021-02-17 NOTE — Telephone Encounter (Signed)
Pt has questions regarding prep med.. Pt states she was given frm the pharmacy. One large container/ no pouches were provided.. Plz advise  thanks

## 2021-02-17 NOTE — Progress Notes (Signed)
VIALS MADE. EXP 02-17-22 

## 2021-02-17 NOTE — Telephone Encounter (Signed)
Spoke with pt and it sounds like she received Golytely at the pharmacy.  I called the pharmacy and they stated that they did give Golytely because Moviprep is on backorder.  I offered for pt to be sent instructions for Golytely via MyChart since that is what she has at home.  I also offered her Sutabs and new instructions.  She asks that both be left at the third floor and she will pick it up today.  She states she isn't sure which she will do- she will decide when she gets to our facility.  She knows to pick up on the 3rd floor by 4:30 pm today  Both sets of instructions printed and sent via MyChart. Sutab sample left of third floor as well

## 2021-02-18 ENCOUNTER — Other Ambulatory Visit: Payer: Self-pay

## 2021-02-18 ENCOUNTER — Encounter: Payer: Self-pay | Admitting: Internal Medicine

## 2021-02-18 ENCOUNTER — Ambulatory Visit (AMBULATORY_SURGERY_CENTER): Payer: BC Managed Care – PPO | Admitting: Internal Medicine

## 2021-02-18 VITALS — BP 114/72 | HR 66 | Temp 97.8°F | Resp 15 | Ht 68.0 in | Wt 165.0 lb

## 2021-02-18 DIAGNOSIS — Z1211 Encounter for screening for malignant neoplasm of colon: Secondary | ICD-10-CM | POA: Diagnosis not present

## 2021-02-18 MED ORDER — SODIUM CHLORIDE 0.9 % IV SOLN: 500.0000 mL | Freq: Once | INTRAVENOUS | Status: DC

## 2021-02-18 NOTE — Op Note (Signed)
[name] Patient Name: Cassandra Murphy Procedure Date: 02/18/2021 9:45 AM MRN: [id number] Endoscopist: Wilhemina Bonito. Marina Goodell , MD Age: 45 Referring MD:  Date of Birth: 01/21/1976 Gender: Female Account #: [id number] Procedure:                Colonoscopy Indications:              Screening for colorectal malignant neoplasm Medicines:                Monitored Anesthesia Care Procedure:                Pre-Anesthesia Assessment:                           - Prior to the procedure, a History and Physical                            was performed, and patient medications and                            allergies were reviewed. The patient's tolerance of                            previous anesthesia was also reviewed. The risks                            and benefits of the procedure and the sedation                            options and risks were discussed with the patient.                            All questions were answered, and informed consent                            was obtained. Prior Anticoagulants: The patient has                            taken no previous anticoagulant or antiplatelet                            agents. ASA Grade Assessment: II - A patient with                            mild systemic disease. After reviewing the risks                            and benefits, the patient was deemed in                            satisfactory condition to undergo the procedure.                           After obtaining informed consent, the colonoscope  was passed under direct vision. Throughout the                            procedure, the patient's blood pressure, pulse, and                            oxygen saturations were monitored continuously. The                            Olympus CF-HQ190L [id number]) Colonoscope was                            introduced through the anus and advanced to the the                            cecum, identified by  appendiceal orifice and                            ileocecal valve. The ileocecal valve, appendiceal                            orifice, and rectum were photographed. The quality                            of the bowel preparation was excellent. The                            colonoscopy was performed without difficulty. The                            patient tolerated the procedure well. The bowel                            preparation used was SUPREP/tablets via split dose                            instruction. Scope In: 9:59:24 AM Scope Out: 10:12:30 AM Scope Withdrawal Time: 0 hours 10 minutes 16 seconds  Total Procedure Duration: 0 hours 13 minutes 6 seconds  Findings:                 Multiple diverticula were found in the entire colon.                           Internal hemorrhoids were found during retroflexion.                           The exam was otherwise without abnormality on                            direct and retroflexion views. Complications:            No immediate complications. Estimated blood loss:  None. Estimated Blood Loss:     Estimated blood loss: none. Impression:               - Diverticulosis in the entire examined colon.                           - Internal hemorrhoids.                           - The examination was otherwise normal on direct                            and retroflexion views.                           - No specimens collected. Recommendation:           - Repeat colonoscopy in 10 years for screening                            purposes.                           - Patient has a contact number available for                            emergencies. The signs and symptoms of potential                            delayed complications were discussed with the                            patient. Return to normal activities tomorrow.                            Written discharge instructions were provided to the                             patient.                           - Resume previous diet.                           - Continue present medications. Wilhemina Bonito. Marina Goodell, MD 02/18/2021 10:18:08 AM This report has been signed electronically.

## 2021-02-18 NOTE — Progress Notes (Signed)
pt tolerated well. VSS. awake and to recovery. Report given to RN.  

## 2021-02-18 NOTE — Patient Instructions (Signed)
Handouts given for Diverticulosis and Hemorrhoids.  Repeat a colonoscopy in 10 years.  YOU HAD AN ENDOSCOPIC PROCEDURE TODAY AT THE [facility]:   Refer to the procedure report that was given to you for any specific questions about what was found during the examination.  If the procedure report does not answer your questions, please call your gastroenterologist to clarify.  If you requested that your care partner not be given the details of your procedure findings, then the procedure report has been included in a sealed envelope for you to review at your convenience later.  YOU SHOULD EXPECT: Some feelings of bloating in the abdomen. Passage of more gas than usual.  Walking can help get rid of the air that was put into your GI tract during the procedure and reduce the bloating. If you had a lower endoscopy (such as a colonoscopy or flexible sigmoidoscopy) you may notice spotting of blood in your stool or on the toilet paper. If you underwent a bowel prep for your procedure, you may not have a normal bowel movement for a few days.  Please Note:  You might notice some irritation and congestion in your nose or some drainage.  This is from the oxygen used during your procedure.  There is no need for concern and it should clear up in a day or so.  SYMPTOMS TO REPORT IMMEDIATELY:  Following lower endoscopy (colonoscopy or flexible sigmoidoscopy):  Excessive amounts of blood in the stool  Significant tenderness or worsening of abdominal pains  Swelling of the abdomen that is new, acute  Fever of 100F or higher  For urgent or emergent issues, a gastroenterologist can be reached at any hour by calling [phone]. Do not use MyChart messaging for urgent concerns.    DIET:  We do recommend a small meal at first, but then you may proceed to your regular diet.  Drink plenty of fluids but you should avoid alcoholic beverages for 24 hours.  ACTIVITY:  You should plan to take it easy for  the rest of today and you should NOT DRIVE or use heavy machinery until tomorrow (because of the sedation medicines used during the test).    FOLLOW UP: Our staff will call the number listed on your records 48-72 hours following your procedure to check on you and address any questions or concerns that you may have regarding the information given to you following your procedure. If we do not reach you, we will leave a message.  We will attempt to reach you two times.  During this call, we will ask if you have developed any symptoms of COVID 19. If you develop any symptoms (ie: fever, flu-like symptoms, shortness of breath, cough etc.) before then, please call [phone].  If you test positive for Covid 19 in the 2 weeks post procedure, please call and report this information to [place].    If any biopsies were taken you will be contacted by phone or by letter within the next 1-3 weeks.  Please call us at [phone] if you have not heard about the biopsies in 3 weeks.    SIGNATURES/CONFIDENTIALITY: You and/or your care partner have signed paperwork which will be entered into your electronic medical record.  These signatures attest to the fact that that the information above on your After Visit Summary has been reviewed and is understood.  Full responsibility of the confidentiality of this discharge information lies with you and/or your care-partner.

## 2021-02-18 NOTE — Progress Notes (Signed)
Pt's states no medical or surgical changes since previsit or office visit. 

## 2021-02-20 ENCOUNTER — Telehealth: Payer: Self-pay

## 2021-02-20 ENCOUNTER — Telehealth: Payer: Self-pay | Admitting: *Deleted

## 2021-02-20 NOTE — Telephone Encounter (Signed)
First post procedure follow up call, left message. °

## 2021-02-20 NOTE — Telephone Encounter (Signed)
  Follow up Call-  Call back number 02/18/2021  Post procedure Call Back phone  # [phone]  Permission to leave phone message Yes  Some recent data might be hidden     Patient questions:  Do you have a fever, pain , or abdominal swelling? No. Pain Score  0 *  Have you tolerated food without any problems? Yes.    Have you been able to return to your normal activities? Yes.    Do you have any questions about your discharge instructions: Diet   No. Medications  No. Follow up visit  No.  Do you have questions or concerns about your Care? No.  Actions: * If pain score is 4 or above: No action needed, pain <4.Have you developed a fever since your procedure? no  2.   Have you had an respiratory symptoms (SOB or cough) since your procedure? no  3.   Have you tested positive for COVID 19 since your procedure no  4.   Have you had any family members/close contacts diagnosed with the COVID 19 since your procedure?  no   If yes to any of these questions please route to Laverna Peace, RN and Karlton Lemon, RN

## 2021-03-26 ENCOUNTER — Ambulatory Visit (INDEPENDENT_AMBULATORY_CARE_PROVIDER_SITE_OTHER): Payer: BC Managed Care – PPO | Admitting: *Deleted

## 2021-03-26 DIAGNOSIS — J309 Allergic rhinitis, unspecified: Secondary | ICD-10-CM | POA: Diagnosis not present

## 2021-04-15 DIAGNOSIS — Z130 Encounter for screening for diseases of the blood and blood-forming organs and certain disorders involving the immune mechanism: Secondary | ICD-10-CM | POA: Diagnosis not present

## 2021-04-15 DIAGNOSIS — Z124 Encounter for screening for malignant neoplasm of cervix: Secondary | ICD-10-CM | POA: Diagnosis not present

## 2021-04-15 DIAGNOSIS — Z01419 Encounter for gynecological examination (general) (routine) without abnormal findings: Secondary | ICD-10-CM | POA: Diagnosis not present

## 2021-04-15 DIAGNOSIS — Z1151 Encounter for screening for human papillomavirus (HPV): Secondary | ICD-10-CM | POA: Diagnosis not present

## 2021-04-15 DIAGNOSIS — Z1231 Encounter for screening mammogram for malignant neoplasm of breast: Secondary | ICD-10-CM | POA: Diagnosis not present

## 2021-05-01 ENCOUNTER — Ambulatory Visit (INDEPENDENT_AMBULATORY_CARE_PROVIDER_SITE_OTHER): Payer: BC Managed Care – PPO

## 2021-05-01 DIAGNOSIS — J309 Allergic rhinitis, unspecified: Secondary | ICD-10-CM

## 2021-05-11 ENCOUNTER — Ambulatory Visit (INDEPENDENT_AMBULATORY_CARE_PROVIDER_SITE_OTHER): Payer: BC Managed Care – PPO

## 2021-05-11 DIAGNOSIS — J309 Allergic rhinitis, unspecified: Secondary | ICD-10-CM | POA: Diagnosis not present

## 2021-05-22 ENCOUNTER — Ambulatory Visit (INDEPENDENT_AMBULATORY_CARE_PROVIDER_SITE_OTHER): Payer: BC Managed Care – PPO | Admitting: *Deleted

## 2021-05-22 DIAGNOSIS — J309 Allergic rhinitis, unspecified: Secondary | ICD-10-CM | POA: Diagnosis not present

## 2021-05-28 DIAGNOSIS — H5213 Myopia, bilateral: Secondary | ICD-10-CM | POA: Diagnosis not present

## 2021-05-28 DIAGNOSIS — H04123 Dry eye syndrome of bilateral lacrimal glands: Secondary | ICD-10-CM | POA: Diagnosis not present

## 2021-06-01 ENCOUNTER — Ambulatory Visit (INDEPENDENT_AMBULATORY_CARE_PROVIDER_SITE_OTHER): Payer: BC Managed Care – PPO

## 2021-06-01 DIAGNOSIS — J309 Allergic rhinitis, unspecified: Secondary | ICD-10-CM | POA: Diagnosis not present

## 2021-06-11 ENCOUNTER — Ambulatory Visit (INDEPENDENT_AMBULATORY_CARE_PROVIDER_SITE_OTHER): Payer: BC Managed Care – PPO | Admitting: *Deleted

## 2021-06-11 DIAGNOSIS — J309 Allergic rhinitis, unspecified: Secondary | ICD-10-CM

## 2021-06-17 ENCOUNTER — Telehealth (HOSPITAL_COMMUNITY): Payer: Self-pay | Admitting: Cardiology

## 2021-06-17 NOTE — Telephone Encounter (Signed)
Will submit authorization to insurance first to see which formulation of semaglutide may be covered by her insurance, then will reach out to pt. Will start with Wegovy. Pt's BMI 28.4 in June of this year. Requires weight-related comorbidity/risk factor (like DM, dyslipidemia, or CAD) to cover. Pt does have dyslipidemia with LDL of 181 in 2017 on [facility].

## 2021-06-17 NOTE — Telephone Encounter (Signed)
Pharmacy clinic referral placed per DM Re:  semaglutide work up

## 2021-06-23 NOTE — Telephone Encounter (Signed)
[facility] prior authorization approved through 10/20/21. Called pt to discuss. No personal or family hx of thyroid cancer. She is interested in starting therapy. Will likely need to start on Ozempic for the first few months since Wegovy starting doses are still on national backorder. Pt also mentions she may need to start on a statin but hasn't had labs checked in years (primarily following with her OBGYN). Will plan to check fasting lipid panel and can start statin if needed (likely will based on LDL in the 180s back in 2017). PharmD appt scheduled for 12/6 next available date. Meds/labs to be ordered under Dr Shirlee Latch as referring physician.

## 2021-07-13 NOTE — Progress Notes (Signed)
Patient ID: Cassandra Murphy                 DOB: 1976/03/04                    MRN: [id number]     HPI: Cassandra Murphy is a 45 y.o. female patient referred to pharmacy clinic by Dr. Aundra Murphy to initiate weight loss therapy with GLP1-RA. PMH is significant for obesity complicated by chronic medical conditions including hyperlipidemia. BMI 28.36. Her insurance has approved PA for Wegovy through 10/20/21. She reports gradually gaining weight over the past few years despite eating a healthy diet and exercising regularly. Her mother is also overweight and the patient is hopeful to manage her own risk factors (weight, cholesterol) earlier on in her life to prevent future complications.   Current weight management medications: none  Previously tried meds: none  Current meds that may affect weight: levonorgestrel (weight gain)  Baseline weight/BMI: 183.8 lb (BMI 28.36)  Insurance payor: [employer]  Diet:  -Breakfast: sometimes skips, if eats - egg on wheat toast -Lunch: if at work doesn't eat lunch; may have a snack type lunch - vegetables and hummus -Dinner: chicken, vegetable, starch - tries to limit carbs; salads, black beans -Snacks: pimento cheese, doesn't snack much -Drinks: coffee, sparkling water, occasional coke zero, no juice or sodas  Exercise: tennis, walk, limited by sprained ankles/knee pain; 3-5 times per week  Family History: dad has high cholesterol; mom overweight; both parents have HTN; both grandfathers had either heart attacks or strokes  Social History: Never smoker  Labs: No results found for: HGBA1C  Wt Readings from Last 1 Encounters:  02/18/21 165 lb (74.8 kg)    BP Readings from Last 1 Encounters:  02/18/21 114/72   Pulse Readings from Last 1 Encounters:  02/18/21 66    No results found for: CHOL, TRIG, HDL, CHOLHDL, VLDL, LDLCALC, LDLDIRECT  Past Medical History:  Diagnosis Date   Allergic rhinitis    Asthma    GERD (gastroesophageal reflux disease)     Hyperlipidemia    Plantar fasciitis    Pneumonia    Shingles     Current Outpatient Medications on File Prior to Visit  Medication Sig Dispense Refill   albuterol (PROAIR HFA) 108 (90 Base) MCG/ACT inhaler Inhale 2 puffs into the lungs every 4 (four) hours as needed for wheezing or shortness of breath. 18 g 1   cetirizine (ZYRTEC) 10 MG tablet Take 10 mg by mouth daily.     EPINEPHrine 0.3 mg/0.3 mL IJ SOAJ injection Inject 0.3 mg into the muscle as needed for anaphylaxis. 2 each 1   ibuprofen (ADVIL,MOTRIN) 200 MG tablet Take 200 mg by mouth as needed.      levonorgestrel (MIRENA, 52 MG,) 20 MCG/24HR IUD Mirena 20 mcg/24 hours (6 yrs) 52 mg intrauterine device  Take 1 device by intrauterine route.     meloxicam (MOBIC) 15 MG tablet Take 1 tablet (15 mg total) by mouth daily. 30 tablet 0   pantoprazole (PROTONIX) 40 MG tablet Take 1 tablet (40 mg total) by mouth daily. 90 tablet 3   No current facility-administered medications on file prior to visit.    Allergies  Allergen Reactions   Codeine Nausea And Vomiting     Assessment/Plan:  1. Weight loss - Patient has not met goal of at least 5% of body weight loss with comprehensive lifestyle modifications alone in the past 3-6 months. Pharmacotherapy is appropriate to pursue as augmentation.  Will start semaglutide. Confirmed patient not pregnant and no personal or family history of medullary thyroid carcinoma (MTC) or Multiple Endocrine Neoplasia syndrome type 2 (MEN 2).   Advised patient on common side effects including nausea, diarrhea, dyspepsia, decreased appetite, and fatigue. Counseled patient on reducing meal size and how to titrate medication to minimize side effects. Counseled patient to call if intolerable side effects or if experiencing dehydration, abdominal pain, or dizziness. Patient will adhere to dietary modifications and will target at least 150 minutes of moderate intensity exercise weekly.   GLP1 Agonist Titration  Plan:  Will plan to follow the titration plan as below, pending patient is tolerating each dose before increasing to the next. Can slow titration if needed for tolerability.   Ozempic to [facility]:  Since first 3 starting doses of Wegovy are on national backorder, will provide pt with a sample Ozempic pen for the first two months, then re-evaluate in the new year if Cassandra Murphy is available at that point or if we will need to continue using Ozempic for the third month. Dosing titration schedule noted below: -Month 1: Inject Ozempic 0.25 mg SQ once weekly x 4 weeks -Month 2: Inject Ozempic 0.5 mg  SQ once weekly x 6 weeks (to finish out the 2 sample pens) -Month 3: Inject Ozempic (or Wegovy, pending availability) 1 mg SQ once weekly x 4 weeks -Month 4: Inject Wegovy 1.7 mg SQ once weekly x 4 weeks -Month 5+: Inject Wegovy 2.4 mg SQ once weekly   Will check a baseline A1c and lipid panel today. BP is slightly elevated today. She reports home BP of 118/70. Encouraged her to check BP at home and let us know if it is elevated. We will check her BP again at next visit.   Follow up in 4 weeks by phone. Follow up in 3 months in office.   Cassandra Murphy, PharmD PGY2 Ambulatory Care Pharmacy Resident 07/14/2021 8:38 AM

## 2021-07-14 ENCOUNTER — Other Ambulatory Visit: Payer: Self-pay

## 2021-07-14 ENCOUNTER — Ambulatory Visit: Payer: BC Managed Care – PPO | Admitting: Student-PharmD

## 2021-07-14 VITALS — BP 134/80 | Ht 67.5 in | Wt 183.8 lb

## 2021-07-14 DIAGNOSIS — E785 Hyperlipidemia, unspecified: Secondary | ICD-10-CM | POA: Insufficient documentation

## 2021-07-14 DIAGNOSIS — E663 Overweight: Secondary | ICD-10-CM

## 2021-07-14 DIAGNOSIS — E782 Mixed hyperlipidemia: Secondary | ICD-10-CM

## 2021-07-14 DIAGNOSIS — Z131 Encounter for screening for diabetes mellitus: Secondary | ICD-10-CM

## 2021-07-14 LAB — LIPID PANEL
Chol/HDL Ratio: 5.9 ratio — ABNORMAL HIGH (ref 0.0–4.4)
Cholesterol, Total: 218 mg/dL — ABNORMAL HIGH (ref 100–199)
HDL: 37 mg/dL — ABNORMAL LOW (ref >39)
LDL Chol Calc (NIH): 162 mg/dL — ABNORMAL HIGH (ref 0–99)
Triglycerides: 105 mg/dL (ref 0–149)
VLDL Cholesterol Cal: 19 mg/dL (ref 5–40)

## 2021-07-14 LAB — HEMOGLOBIN A1C
Est. average glucose Bld gHb Est-mCnc: 105 mg/dL
Hgb A1c MFr Bld: 5.3 % (ref 4.8–5.6)

## 2021-07-14 NOTE — Patient Instructions (Signed)
Ozempic & [id number] Receptor Agonist Counseling Points This medication reduces your appetite and may make you feel fuller longer.  Stop eating when your body tells you that you are full. This will likely happen sooner than you are used to. Store your medication in the fridge until you are ready to use it. Inject your medication in the fatty tissue of your lower abdominal area (2 inches away from belly button) or upper outer thigh. Rotate injection sites. Each pen will last you about 1 month (the first month it will last a few weeks longer). Use a different needle with each weekly injection. Common side effects include: nausea, diarrhea/constipation, and heartburn, and are more likely to occur if you overeat.  Dosing schedule: - Inject Ozempic 0.25 mg weekly for 4 weeks (using the 1st pen) - Inject Ozempic 0.5 mg weekly for 6 weeks (2 weeks of doses are remaining in the 1st pen, then switch to the second pen for 4 weeks) - We will touch base over whether we need to continue with Ozempic for the 1 mg dose or can switch to [facility]  - Will continue titrating every 4 weeks: 1 mg, 1.7 mg, then 2.4 mg  Tips for living a healthier life     Building a Healthy and Balanced Diet Make most of your meal vegetables and fruits -  of your plate. Aim for color and variety, and remember that potatoes don't count as vegetables on the Healthy Eating Plate because of their negative impact on blood sugar.  Go for whole grains -  of your plate. Whole and intact grains--whole wheat, barley, wheat berries, quinoa, oats, brown rice, and foods made with them, such as whole wheat pasta--have a milder effect on blood sugar and insulin than white bread, white rice, and other refined grains.  Protein power -  of your plate. Fish, poultry, beans, and nuts are all healthy, versatile protein sources--they can be mixed into salads, and pair well with vegetables on a plate. Limit red meat, and avoid processed meats such as  bacon and sausage.  Healthy plant oils - in moderation. Choose healthy vegetable oils like olive, canola, soy, corn, sunflower, peanut, and others, and avoid partially hydrogenated oils, which contain unhealthy trans fats. Remember that low-fat does not mean "healthy."  Drink water, coffee, or tea. Skip sugary drinks, limit milk and dairy products to one to two servings per day, and limit juice to a small glass per day.  Stay active. The red figure running across the Healthy Eating Plate's placemat is a reminder that staying active is also important in weight control.  The main message of the Healthy Eating Plate is to focus on diet quality:  The type of carbohydrate in the diet is more important than the amount of carbohydrate in the diet, because some sources of carbohydrate--like vegetables (other than potatoes), fruits, whole grains, and beans--are healthier than others. The Healthy Eating Plate also advises consumers to avoid sugary beverages, a major source of calories--usually with little nutritional value--in the American diet. The Healthy Eating Plate encourages consumers to use healthy oils, and it does not set a maximum on the percentage of calories people should get each day from healthy sources of fat. In this way, the Healthy Eating Plate recommends the opposite of the low-fat message promoted for decades by the USDA.  [employer]  SUGAR  Sugar is a huge problem in the modern day diet. Sugar is a big contributor to heart disease, diabetes, high triglyceride levels, fatty  liver disease and obesity. Sugar is hidden in almost all packaged foods/beverages. Added sugar is extra sugar that is added beyond what is naturally found and has no nutritional benefit for your body. The American Heart Association recommends limiting added sugars to no more than 25g for women and 36 grams for men per day. There are many names for sugar  including maltose, sucrose (names ending in "ose"), high fructose corn syrup, molasses, cane sugar, corn sweetener, raw sugar, syrup, honey or fruit juice concentrate.   One of the best ways to limit your added sugars is to stop drinking sweetened beverages such as soda, sweet tea, and fruit juice.  There is 65g of added sugars in one 20oz bottle of Coke! That is equal to 7.5 donuts.   Pay attention and read all nutrition facts labels. Below is an examples of a nutrition facts label. The #1 is showing you the total sugars where the # 2 is showing you the added sugars. This one serving has almost the max amount of added sugars per day!     20 oz Soda 65g Sugar = 7.5 Glazed Donuts  16oz Energy  Drink 54g Sugar = 6.5 Glazed Donuts  Large Sweet  Tea 38g Sugar = 4 Glazed Donuts  20oz Sports  Drink 34g Sugar = 3.5 Glazed Donuts  8oz Chocolate Milk 24g Sugar =2.5 Glazed Donuts  8oz Orange  Juice 21g Sugar = 2 Glazed Donuts  1 Juice Box 14g Sugar = 1.5 Glazed Donuts  16oz Water= NO SUGAR!!  EXERCISE  Exercise is good. We've all heard that. In an ideal world, we would all have time and resources to get plenty of it. When you are active, your heart pumps more efficiently and you will feel better.  Multiple studies show that even walking regularly has benefits that include living a longer life. The American Heart Association recommends 150 minutes per week of exercise (30 minutes per day most days of the week). You can do this in any increment you wish. Nine or more 10-minute walks count. So does an hour-long exercise class. Break the time apart into what will work in your life. Some of the best things you can do include walking briskly, jogging, cycling or swimming laps. Not everyone is ready to "exercise." Sometimes we need to start with just getting active. Here are some easy ways to be more active throughout the day:  Take the stairs instead of the elevator  Go for a 10-15 minute  walk during your lunch break (find a friend to make it more enjoyable)  When shopping, park at the back of the parking lot  If you take public transportation, get off one stop early and walk the extra distance  Pace around while making phone calls  Check with your doctor if you aren't sure what your limitations may be. Always remember to drink plenty of water when doing any type of exercise. Don't feel like a failure if you're not getting the 90-150 minutes per week. If you started by being a couch potato, then just a 10-minute walk each day is a huge improvement. Start with little victories and work your way up.   HEALTHY EATING TIPS  When looking to improve your eating habits, whether to lose weight, lower blood pressure or just be healthier, it helps to know what a serving size is.   Grains 1 slice of bread,  bagel,  cup pasta or rice  Vegetables 1 cup fresh or raw vegetables,  cup cooked or canned Fruits 1 piece of medium sized fruit,  cup canned,   Meats/Proteins  cup dried       1 oz meat, 1 egg,  cup cooked beans, nuts or seeds  Dairy        Fats Individual yogurt container, 1 cup (8oz)    1 teaspoon margarine/butter or vegetable  milk or milk alternative, 1 slice of cheese          oil; 1 tablespoon mayonnaise or salad dressing                  Plan ahead: make a menu of the meals for a week then create a grocery list to go with that menu. Consider meals that easily stretch into a night of leftovers, such as stews or casseroles. Or consider making two of your favorite meal and put one in the freezer for another night. Try a night or two each week that is "meatless" or "no cook" such as salads. When you get home from the grocery store wash and prepare your vegetables and fruits. Then when you need them they are ready to go.   Tips for going to the grocery store:  Buy store or generic brands  Check the weekly ad from your store on-line or in their in-store flyer  Look at the  unit price on the shelf tag to compare/contrast the costs of different items  Buy fruits/vegetables in season  Carrots, bananas and apples are low-cost, naturally healthy items  If meats or frozen vegetables are on sale, buy some extras and put in your freezer  Limit buying prepared or "ready to eat" items, even if they are pre-made salads or fruit snacks  Do not shop when you're hungry  Foods at eye level tend to be more expensive. Look on the high and low shelves for deals.  Consider shopping at the farmer's market for fresh foods in season.  Avoid the cookie and chip aisles (these are expensive, high in calories and low in nutritional value). Shop on the outside of the grocery store.  Healthy food preparations:  If you can't get lean hamburger, be sure to drain the fat when cooking  Steam, saut (in olive oil), grill or bake foods  Experiment with different seasonings to avoid adding salt to your foods. Kosher salt, sea salt and Himalayan salt are all still salt and should be avoided. Try seasoning food with onion, garlic, thyme, rosemary, basil ect. Onion powder or garlic powder is ok. Avoid if it says salt (ie garlic salt).

## 2021-07-15 ENCOUNTER — Telehealth: Payer: Self-pay | Admitting: Student-PharmD

## 2021-07-15 DIAGNOSIS — E782 Mixed hyperlipidemia: Secondary | ICD-10-CM

## 2021-07-15 MED ORDER — ROSUVASTATIN CALCIUM 10 MG PO TABS: 10.0000 mg | ORAL_TABLET | Freq: Every day | ORAL | 3 refills | Status: DC

## 2021-07-15 NOTE — Telephone Encounter (Signed)
Reviewed lab results from office visit yesterday. A1c is normal. LDL is elevated. Goal LDL <100 mg/dL per AACE guidelines. Recommend starting rosuvastatin 10 mg. She is amenable to this plan and this prescription has been sent in to her preferred pharmacy, [facility] on [address]. We will recheck a fasting lipid panel at her next appt with the pharmacy clinic on 10/20/21. She is aware to come fasting to this appt.

## 2021-07-17 ENCOUNTER — Ambulatory Visit (INDEPENDENT_AMBULATORY_CARE_PROVIDER_SITE_OTHER): Payer: BC Managed Care – PPO

## 2021-07-17 DIAGNOSIS — J309 Allergic rhinitis, unspecified: Secondary | ICD-10-CM

## 2021-08-11 ENCOUNTER — Telehealth: Payer: Self-pay | Admitting: Student-PharmD

## 2021-08-11 NOTE — Telephone Encounter (Signed)
Called to see how she is doing after starting Ozempic 0.25 mg about a month ago. She is doing well with no adverse effects or issues. She is not seeing much change in appetite or in weight yet. Explained that we start at such a low dose for tolerability and as we increase the dose she will start to see more of an effect. Will increase to 0.5 mg weekly starting with this week's dose. She was given 2 sample pens at her last visit so she will have 6 weeks worth of 0.5 mg doses before needed another Rx sent in. Will call her in 5-6 weeks to see how she is doing on this dose and plan to increase dose at that time. Hopefully Mancel Parsons will be available at all doses so we can switch over to that. She is also doing well since starting rosuvastatin after last visit. No adverse effects reported. We will recheck a fasting lipid panel at her visit in March.

## 2021-08-14 ENCOUNTER — Ambulatory Visit (INDEPENDENT_AMBULATORY_CARE_PROVIDER_SITE_OTHER): Payer: BC Managed Care – PPO

## 2021-08-14 DIAGNOSIS — J309 Allergic rhinitis, unspecified: Secondary | ICD-10-CM | POA: Diagnosis not present

## 2021-09-09 ENCOUNTER — Other Ambulatory Visit (HOSPITAL_COMMUNITY): Payer: Self-pay

## 2021-09-09 ENCOUNTER — Telehealth: Payer: Self-pay | Admitting: Student-PharmD

## 2021-09-09 MED ORDER — SEMAGLUTIDE-WEIGHT MANAGEMENT 1 MG/0.5ML ~~LOC~~ SOAJ
1.0000 mg | SUBCUTANEOUS | 0 refills | Status: AC
  Filled 2021-09-09: qty 2, 28d supply, fill #0

## 2021-09-09 MED ORDER — SEMAGLUTIDE-WEIGHT MANAGEMENT 2.4 MG/0.75ML ~~LOC~~ SOAJ
2.4000 mg | SUBCUTANEOUS | 11 refills | Status: DC
Start: 2021-11-04 — End: 2022-03-16
  Filled 2021-09-09: qty 3, fill #0
  Filled 2021-11-08: qty 3, 28d supply, fill #0
  Filled 2021-12-15: qty 3, 28d supply, fill #1
  Filled 2022-01-12: qty 3, 28d supply, fill #2
  Filled 2022-02-04: qty 3, 28d supply, fill #3
  Filled 2022-03-03 – 2022-03-15 (×4): qty 3, 28d supply, fill #4

## 2021-09-09 MED ORDER — SEMAGLUTIDE-WEIGHT MANAGEMENT 1.7 MG/0.75ML ~~LOC~~ SOAJ
1.7000 mg | SUBCUTANEOUS | 0 refills | Status: DC
Start: 2021-10-07 — End: 2021-11-09
  Filled 2021-09-09 – 2021-10-14 (×2): qty 3, 28d supply, fill #0

## 2021-09-09 NOTE — Telephone Encounter (Signed)
Spoke with the patient who has taken four weeks of Ozempic 0.5 mg weekly. She is tolerating this well and wants to increase to 1 mg. Reginal Lutes PA has already been approved through 10/20/21. Sent in remainder of Wegovy titration to [facility] per patient preference. She has two doses remaining in her 0.5 mg Ozempic pen and will give both of these next week to equal 1 mg, then start [facility] the week after. I will see her in clinic on 10/20/21 which is the week she will be due to increase to 1.7 mg. Reminded patient of copay card available to bring cost of Wegovy down to $25.

## 2021-09-09 NOTE — Telephone Encounter (Signed)
medication is going to run out prior to coming in for her appt. pt is currently taking ozempic.. please further advise

## 2021-09-20 ENCOUNTER — Other Ambulatory Visit: Payer: Self-pay | Admitting: Allergy & Immunology

## 2021-09-21 ENCOUNTER — Ambulatory Visit (INDEPENDENT_AMBULATORY_CARE_PROVIDER_SITE_OTHER): Payer: BC Managed Care – PPO

## 2021-09-21 DIAGNOSIS — J309 Allergic rhinitis, unspecified: Secondary | ICD-10-CM

## 2021-10-01 ENCOUNTER — Other Ambulatory Visit (HOSPITAL_COMMUNITY): Payer: Self-pay

## 2021-10-13 ENCOUNTER — Other Ambulatory Visit (HOSPITAL_COMMUNITY): Payer: Self-pay

## 2021-10-13 ENCOUNTER — Other Ambulatory Visit: Payer: Self-pay | Admitting: Cardiology

## 2021-10-14 ENCOUNTER — Other Ambulatory Visit (HOSPITAL_COMMUNITY): Payer: Self-pay

## 2021-10-14 ENCOUNTER — Other Ambulatory Visit: Payer: Self-pay | Admitting: Cardiology

## 2021-10-19 NOTE — Progress Notes (Unsigned)
Patient ID: Cassandra Murphy                 DOB: 1976-08-04                    MRN: [id number]     HPI: Cassandra Murphy is a 46 y.o. female patient referred to pharmacy clinic by Dr. Aundra Dubin to initiate weight loss therapy with GLP1-RA. PMH is significant for overweight with BMI 11.03 complicated by chronic medical conditions including hyperlipidemia. Joesph July at last pharmacist visit on 07/14/21. Found LDL to be elevated at baseline labs at that visit and started rosuvastatin 10 mg daily.   Today, ***.  -check weight and BP -need to recheck lipid panel today after starting rosuva10 (goal <100) -any AEs? -update diet/exercise -PA approved through 3/14 - need to resubmit -rest of dose titration already sent in, should be due to increase to 1.7 mg this week? -f/u phone 4 weeks to increase to 2.4. schedule 6 month visit  Current weight management medications: Wegovy 1 mg weekly  Previously tried meds: none  Current meds that may affect weight: levonorgestrel (can cause weight gain)  Baseline weight/BMI: 183.8 lb (BMI 28.36) Current weight/BMI: ***  Insurance payor: [place]. Wegovy PA approved through 10/20/21.   Current hyperlipidemia medications: rosuvastatin 10 mg daily Intolerances: none LDL goal: <100 mg/dL  Diet:  -Breakfast: sometimes skips, if eats - egg on wheat toast -Lunch: if at work doesn't eat lunch; may have a snack type lunch - vegetables and hummus -Dinner: chicken, vegetable, starch - tries to limit carbs; salads, black beans -Snacks: pimento cheese, doesn't snack much -Drinks: coffee, sparkling water, occasional coke zero, no juice or sodas  Exercise: tennis, walk, limited by sprained ankles/knee pain; 3-5 times per week  Family History: dad has high cholesterol; mom overweight; both parents have HTN; both grandfathers had either heart attacks or strokes  Social History: Never smoker  Labs: Lab Results  Component Value Date   HGBA1C 5.3 07/14/2021     Wt Readings from Last 1 Encounters:  07/14/21 183 lb 12.8 oz (83.4 kg)    BP Readings from Last 1 Encounters:  07/14/21 134/80   Pulse Readings from Last 1 Encounters:  02/18/21 66       Component Value Date/Time   CHOL 218 (H) 07/14/2021 0924   TRIG 105 07/14/2021 0924   HDL 37 (L) 07/14/2021 0924   CHOLHDL 5.9 (H) 07/14/2021 0924   LDLCALC 162 (H) 07/14/2021 0924    Past Medical History:  Diagnosis Date   Allergic rhinitis    Asthma    GERD (gastroesophageal reflux disease)    Hyperlipidemia    Plantar fasciitis    Pneumonia    Shingles     Current Outpatient Medications on File Prior to Visit  Medication Sig Dispense Refill   albuterol (PROAIR HFA) 108 (90 Base) MCG/ACT inhaler Inhale 2 puffs into the lungs every 4 (four) hours as needed for wheezing or shortness of breath. 18 g 1   cetirizine (ZYRTEC) 10 MG tablet Take 10 mg by mouth daily.     EPINEPHrine 0.3 mg/0.3 mL IJ SOAJ injection Inject 0.3 mg into the muscle as needed for anaphylaxis. 2 each 1   ibuprofen (ADVIL,MOTRIN) 200 MG tablet Take 200 mg by mouth as needed.      levonorgestrel (MIRENA, 52 MG,) 20 MCG/24HR IUD Mirena 20 mcg/24 hours (6 yrs) 52 mg intrauterine device  Take 1 device by intrauterine route.  meloxicam (MOBIC) 15 MG tablet Take 1 tablet (15 mg total) by mouth daily. 30 tablet 0   pantoprazole (PROTONIX) 40 MG tablet TAKE 1 TABLET(40 MG) BY MOUTH DAILY 90 tablet 1   rosuvastatin (CRESTOR) 10 MG tablet Take 1 tablet (10 mg total) by mouth daily. 90 tablet 3   Semaglutide-Weight Management 1.7 MG/0.75ML SOAJ Inject 1.7 mg into the skin once a week for 28 days. 3 mL 0   [START ON 11/04/2021] Semaglutide-Weight Management 2.4 MG/0.75ML SOAJ Inject 2.4 mg into the skin once a week. 3 mL 11   No current facility-administered medications on file prior to visit.    Allergies  Allergen Reactions   Codeine Nausea And Vomiting     Assessment/Plan:  1. Weight loss - Patient has not  met goal of at least 5% of body weight loss with comprehensive lifestyle modifications alone in the past 3-6 months. Pharmacotherapy is appropriate to pursue as augmentation. Will start semaglutide. Confirmed patient not pregnant and no personal or family history of medullary thyroid carcinoma (MTC) or Multiple Endocrine Neoplasia syndrome type 2 (MEN 2).   Advised patient on common side effects including nausea, diarrhea, dyspepsia, decreased appetite, and fatigue. Counseled patient on reducing meal size and how to titrate medication to minimize side effects. Counseled patient to call if intolerable side effects or if experiencing dehydration, abdominal pain, or dizziness. Patient will adhere to dietary modifications and will target at least 150 minutes of moderate intensity exercise weekly.   GLP1 Agonist Titration Plan:  Will plan to follow the titration plan as below, pending patient is tolerating each dose before increasing to the next. Can slow titration if needed for tolerability.  -Month 1: Inject Ozempic 0.25 mg SQ once weekly x 4 weeks -Month 2: Inject Ozempic 0.5 mg  SQ once weekly x 6 weeks (to finish out the 2 sample pens) -Month 3: Inject Wegovy 1 mg SQ once weekly x 4 weeks -Month 4: Inject Wegovy 1.7 mg SQ once weekly x 4 weeks -Month 5+: Inject Wegovy 2.4 mg SQ once weekly   Will check a baseline A1c and lipid panel today. BP is slightly elevated today. She reports home BP of 118/70. Encouraged her to check BP at home and let us know if it is elevated. We will check her BP again at next visit.   Follow up in 4 weeks by phone for final dose titration to max dose. Follow up in 3 months in office.  Rebbeca Paul, PharmD PGY2 Ambulatory Care Pharmacy Resident 10/19/2021 9:17 AM

## 2021-10-20 ENCOUNTER — Ambulatory Visit: Payer: BC Managed Care – PPO | Admitting: Student-PharmD

## 2021-10-20 ENCOUNTER — Other Ambulatory Visit: Payer: Self-pay

## 2021-10-20 VITALS — BP 118/80 | HR 80 | Wt 174.8 lb

## 2021-10-20 DIAGNOSIS — E663 Overweight: Secondary | ICD-10-CM

## 2021-10-20 DIAGNOSIS — E782 Mixed hyperlipidemia: Secondary | ICD-10-CM

## 2021-10-20 LAB — LIPID PANEL
Chol/HDL Ratio: 3.1 ratio (ref 0.0–4.4)
Cholesterol, Total: 141 mg/dL (ref 100–199)
HDL: 46 mg/dL (ref >39)
LDL Chol Calc (NIH): 74 mg/dL (ref 0–99)
Triglycerides: 114 mg/dL (ref 0–149)
VLDL Cholesterol Cal: 21 mg/dL (ref 5–40)

## 2021-10-20 LAB — HEPATIC FUNCTION PANEL
ALT: 11 IU/L (ref 0–32)
AST: 15 IU/L (ref 0–40)
Albumin: 4.7 g/dL (ref 3.8–4.8)
Alkaline Phosphatase: 47 IU/L (ref 44–121)
Bilirubin Total: 0.3 mg/dL (ref 0.0–1.2)
Bilirubin, Direct: 0.1 mg/dL (ref 0.00–0.40)
Total Protein: 6.6 g/dL (ref 6.0–8.5)

## 2021-10-20 NOTE — Patient Instructions (Signed)
Continue Wegovy 1.7 mg weekly. I'll call you in a few weeks when you're due to increase to 2.4 mg. I will also resubmit your prior authorization today for continued insurance coverage.  ? ?We are checking your lipid panel today to see if your LDL is at goal less than 100 after starting rosuvastatin.  ? ?Tips for living a healthier life ? ? ? ? ?Building a Chief of Staff Diet ?Make most of your meal vegetables and fruits - ? of your plate. ?Aim for color and variety, and remember that potatoes don?t count as vegetables on the Healthy Eating Plate because of their negative impact on blood sugar. ? ?Go for whole grains - ? of your plate. ?Whole and intact grains--whole wheat, barley, wheat berries, quinoa, oats, brown rice, and foods made with them, such as whole wheat pasta--have a milder effect on blood sugar and insulin than white bread, white rice, and other refined grains. ? ?Protein power - ? of your plate. ?Fish, poultry, beans, and nuts are all healthy, versatile protein sources--they can be mixed into salads, and pair well with vegetables on a plate. Limit red meat, and avoid processed meats such as bacon and sausage. ? ?Healthy plant oils - in moderation. ?Choose healthy vegetable oils like olive, canola, soy, corn, sunflower, peanut, and others, and avoid partially hydrogenated oils, which contain unhealthy trans fats. Remember that low-fat does not mean ?healthy.? ? ?Drink water, coffee, or tea. ?Skip sugary drinks, limit milk and dairy products to one to two servings per day, and limit juice to a small glass per day. ? ?Stay active. ?The red figure running across the Healthy Eating Plate?s placemat is a reminder that staying active is also important in weight control. ? ?The main message of the Healthy Eating Plate is to focus on diet quality: ? ?The type of carbohydrate in the diet is more important than the amount of carbohydrate in the diet, because some sources of carbohydrate--like vegetables  (other than potatoes), fruits, whole grains, and beans--are healthier than others. ?The Healthy Eating Plate also advises consumers to avoid sugary beverages, a major source of calories--usually with little nutritional value--in the American diet. ?The Healthy Eating Plate encourages consumers to use healthy oils, and it does not set a maximum on the percentage of calories people should get each day from healthy sources of fat. In this way, the Healthy Eating Plate recommends the opposite of the low-fat message promoted for decades by the USDA. ? ?CueTune.com.ee ? ?SUGAR ? ?Sugar is a huge problem in the modern day diet. Sugar is a big contributor to heart disease, diabetes, high triglyceride levels, fatty liver disease and obesity. Sugar is hidden in almost all packaged foods/beverages. Added sugar is extra sugar that is added beyond what is naturally found and has no nutritional benefit for your body. The American Heart Association recommends limiting added sugars to no more than 25g for women and 36 grams for men per day. There are many names for sugar including maltose, sucrose (names ending in "ose"), high fructose corn syrup, molasses, cane sugar, corn sweetener, raw sugar, syrup, honey or fruit juice concentrate.  ? ?One of the best ways to limit your added sugars is to stop drinking sweetened beverages such as soda, sweet tea, and fruit juice. ? ?There is 65g of added sugars in one 20oz bottle of Coke! That is equal to 7.5 donuts.  ? ?Pay attention and read all nutrition facts labels. Below is an examples of a nutrition facts  label. The #1 is showing you the total sugars where the # 2 is showing you the added sugars. This one serving has almost the max amount of added sugars per day! ? ? ? ? ?20 oz Soda ?65g Sugar = 7.5 Glazed Donuts ? ?16oz Energy  ?Drink ?54g Sugar = 6.5 Glazed Donuts ? ?Large Sweet  ?Tea ?38g Sugar = 4 Glazed Donuts ? ?20oz Sports   ?Drink ?34g Sugar = 3.5 Glazed Donuts ? ?8oz Chocolate Milk ?24g Sugar =2.5 Glazed Donuts ? ?8oz Orange  ?Juice ?21g Sugar = 2 Glazed Donuts ? ?1 Juice Box ?14g Sugar = 1.5 Glazed Donuts ? ?16oz Water= NO SUGAR!! ? ?EXERCISE ? ?Exercise is good. We?ve all heard that. In an ideal world, we would all have time and resources to get plenty of it. When you are active, your heart pumps more efficiently and you will feel better.  Multiple studies show that even walking regularly has benefits that include living a longer life. The American Heart Association recommends 150 minutes per week of exercise (30 minutes per day most days of the week). You can do this in any increment you wish. Nine or more 10-minute walks count. So does an hour-long exercise class. Break the time apart into what will work in your life. Some of the best things you can do include walking briskly, jogging, cycling or swimming laps. Not everyone is ready to ?exercise.? Sometimes we need to start with just getting active. Here are some easy ways to be more active throughout the day: ? Take the stairs instead of the elevator ? Go for a 10-15 minute walk during your lunch break (find a friend to make it more enjoyable) ? When shopping, park at the back of the parking lot ? If you take public transportation, get off one stop early and walk the extra distance ? Pace around while making phone calls ? ?Check with your doctor if you aren?t sure what your limitations may be. Always remember to drink plenty of water when doing any type of exercise. Don?t feel like a failure if you?re not getting the 90-150 minutes per week. If you started by being a couch potato, then just a 10-minute walk each day is a huge improvement. Start with little victories and work your way up. ? ? ?HEALTHY EATING TIPS ? ?When looking to improve your eating habits, whether to lose weight, lower blood pressure or just be healthier, it helps to know what a serving size is.  ? ?Grains ?1  slice of bread, ? bagel, ? cup pasta or rice  Vegetables ?1 cup fresh or raw vegetables, ? cup cooked or canned ?Fruits ?1 piece of medium sized fruit, ? cup canned,   Meats/Proteins ?? cup dried       1 oz meat, 1 egg, ? cup cooked beans, nuts or seeds ? ?Dairy        Fats ?Individual yogurt container, 1 cup (8oz)    1 teaspoon margarine/butter or vegetable  ?milk or milk alternative, 1 slice of cheese          oil; 1 tablespoon mayonnaise or salad dressing                 ? ?Plan ahead: make a menu of the meals for a week then create a grocery list to go with that menu. Consider meals that easily stretch into a night of leftovers, such as stews or casseroles. Or consider making two of your favorite meal  and put one in the freezer for another night. Try a night or two each week that is ?meatless? or ?no cook? such as salads. When you get home from the grocery store wash and prepare your vegetables and fruits. Then when you need them they are ready to go.  ? ?Tips for going to the grocery store: ? [place] store or generic brands ? Check the weekly ad from your store on-line or in their in-store flyer ? Look at the unit price on the shelf tag to compare/contrast the costs of different items ? Buy fruits/vegetables in season ? Carrots, bananas and apples are low-cost, naturally healthy items ? If meats or frozen vegetables are on sale, buy some extras and put in your freezer ? Limit buying prepared or ?ready to eat? items, even if they are pre-made salads or fruit snacks ? Do not shop when you?re hungry ? Foods at eye level tend to be more expensive. Look on the high and low shelves for deals. ? Consider shopping at the farmer?s market for fresh foods in season. ? Avoid the cookie and chip aisles (these are expensive, high in calories and low in nutritional value). Shop on the outside of the grocery store. ? ?Healthy food preparations: ? If you can?t get lean hamburger, be sure to drain the fat when cooking ? Steam, saut?  (in olive oil), grill or bake foods ? Experiment with different seasonings to avoid adding salt to your foods. Kosher salt, sea salt and Himalayan salt are all still salt and should be avoided. Try seasoning

## 2021-10-21 ENCOUNTER — Telehealth: Payer: Self-pay | Admitting: Student-PharmD

## 2021-10-21 NOTE — Telephone Encounter (Signed)
LDL is now at goal <100 mg/dL after starting rosuvastatin 10 mg daily. Called patient to review results and to continue current medications. Also let her know about the [facility] savings card that brings the copay down to $0 which may be useful depending on what her copay is.  ? ?Submitted PA for continuation of therapy for [facility] yesterday which is still pending.  ?

## 2021-10-22 NOTE — Telephone Encounter (Signed)
PA for [facility] approved through 02/22/22.  ?

## 2021-10-23 ENCOUNTER — Ambulatory Visit (INDEPENDENT_AMBULATORY_CARE_PROVIDER_SITE_OTHER): Payer: BC Managed Care – PPO | Admitting: *Deleted

## 2021-10-23 DIAGNOSIS — J309 Allergic rhinitis, unspecified: Secondary | ICD-10-CM

## 2021-11-02 DIAGNOSIS — J3089 Other allergic rhinitis: Secondary | ICD-10-CM | POA: Diagnosis not present

## 2021-11-02 NOTE — Progress Notes (Signed)
VIALS EXP 11-03-22 ?

## 2021-11-09 ENCOUNTER — Other Ambulatory Visit (HOSPITAL_COMMUNITY): Payer: Self-pay

## 2021-11-10 ENCOUNTER — Telehealth: Payer: Self-pay | Admitting: Student-PharmD

## 2021-11-10 NOTE — Telephone Encounter (Signed)
Called patient to see how he is doing on Wegovy 1.7 mg. She is due to increase to the max dose of Wegovy at 2.4 mg tomorrow.  ? ?She reports she is doing well without adverse effects. She has picked up the 2.4 mg dose and will start on it tomorrow. Will call her again in 4 weeks to confirm she is doing well on this dose then we will see her again in the office on 01/19/22 for her 6 month visit.  ?

## 2021-12-08 ENCOUNTER — Telehealth: Payer: Self-pay | Admitting: Student-PharmD

## 2021-12-08 NOTE — Telephone Encounter (Signed)
Called patient to follow up on how she is doing since increasing Wegovy to 2.4 mg. She reports tolerating this well with no adverse effects. She is scheduled for her 6 month follow up visit on 6/13.  ?

## 2021-12-15 ENCOUNTER — Ambulatory Visit (INDEPENDENT_AMBULATORY_CARE_PROVIDER_SITE_OTHER): Payer: BC Managed Care – PPO

## 2021-12-15 ENCOUNTER — Other Ambulatory Visit (HOSPITAL_COMMUNITY): Payer: Self-pay

## 2021-12-15 DIAGNOSIS — J309 Allergic rhinitis, unspecified: Secondary | ICD-10-CM

## 2021-12-23 ENCOUNTER — Telehealth: Payer: Self-pay | Admitting: Allergy & Immunology

## 2021-12-23 MED ORDER — PANTOPRAZOLE SODIUM 40 MG PO TBEC: DELAYED_RELEASE_TABLET | ORAL | 0 refills | Status: DC

## 2021-12-23 NOTE — Telephone Encounter (Signed)
Patient called and made appointment for 01/19/2022 and needs a refill on pantoprazole called into [facility].// [phone]. ?

## 2021-12-23 NOTE — Telephone Encounter (Signed)
Refill as requested. Patient have been notified. 

## 2021-12-30 ENCOUNTER — Telehealth: Payer: Self-pay

## 2021-12-30 NOTE — Telephone Encounter (Signed)
Lmom to r/s appt to the 20th for pharmd as requested by dr. Roselind Messier. Routing to myself to continue calling a couple more times

## 2021-12-31 NOTE — Telephone Encounter (Signed)
Called and r/s for the 20th as requested

## 2022-01-12 ENCOUNTER — Ambulatory Visit (INDEPENDENT_AMBULATORY_CARE_PROVIDER_SITE_OTHER): Payer: BC Managed Care – PPO

## 2022-01-12 ENCOUNTER — Other Ambulatory Visit (HOSPITAL_COMMUNITY): Payer: Self-pay

## 2022-01-12 DIAGNOSIS — J309 Allergic rhinitis, unspecified: Secondary | ICD-10-CM | POA: Diagnosis not present

## 2022-01-19 ENCOUNTER — Encounter: Payer: Self-pay | Admitting: Allergy & Immunology

## 2022-01-19 ENCOUNTER — Ambulatory Visit: Payer: BC Managed Care – PPO | Admitting: Allergy & Immunology

## 2022-01-19 ENCOUNTER — Ambulatory Visit: Payer: BC Managed Care – PPO

## 2022-01-19 VITALS — BP 136/70 | HR 75 | Temp 98.2°F | Resp 16 | Wt 171.2 lb

## 2022-01-19 DIAGNOSIS — J3089 Other allergic rhinitis: Secondary | ICD-10-CM

## 2022-01-19 DIAGNOSIS — J4520 Mild intermittent asthma, uncomplicated: Secondary | ICD-10-CM | POA: Diagnosis not present

## 2022-01-19 DIAGNOSIS — K219 Gastro-esophageal reflux disease without esophagitis: Secondary | ICD-10-CM | POA: Diagnosis not present

## 2022-01-19 DIAGNOSIS — L508 Other urticaria: Secondary | ICD-10-CM | POA: Diagnosis not present

## 2022-01-19 MED ORDER — PANTOPRAZOLE SODIUM 40 MG PO TBEC: DELAYED_RELEASE_TABLET | ORAL | 3 refills | Status: AC

## 2022-01-19 MED ORDER — IPRATROPIUM BROMIDE 0.03 % NA SOLN: 2.0000 | Freq: Three times a day (TID) | NASAL | 3 refills | Status: AC

## 2022-01-19 MED ORDER — ALBUTEROL SULFATE HFA 108 (90 BASE) MCG/ACT IN AERS: 2.0000 | INHALATION_SPRAY | Freq: Four times a day (QID) | RESPIRATORY_TRACT | 2 refills | Status: AC | PRN

## 2022-01-19 NOTE — Patient Instructions (Addendum)
1. Seasonal and perennial allergic rhinitis - We are going to increase the pollens in the next vial. - We will continue with dust mite vial as well.  - Continue with cetirizine daily as needed. - Add a nose spray for particularly runny nose days. - Add on Atrovent one spray per nostril every 8 hours as needed.   2. Chronic urticaria - resolved  - Continue with cetirizine 10mg  daily as needed.   3. Mild intermittent asthma, uncomplicated - Continue with albuterol as needed.  4. GERD - Continue with pantoprazole 40mg  daily.   5. Return in about 1 year (around 01/20/2023).    Please inform of any Emergency Department visits, hospitalizations, or changes in symptoms. Call 01/22/2023 before going to the ED for breathing or allergy symptoms since we might be able to fit you in for a sick visit. Feel free to contact us anytime with any questions, problems, or concerns.  It was a pleasure to see you again today!  Websites that have reliable patient information: 1. American Academy of Asthma, Allergy, and Immunology: www.aaaai.org 2. Food Allergy Research and Education (FARE): foodallergy.org 3. Mothers of Asthmatics: http://www.asthmacommunitynetwork.org 4. American College of Allergy, Asthma, and Immunology: www.acaai.org   COVID-19 Vaccine Information can be found at: [place] For questions related to vaccine distribution or appointments, please email [email or web address] or call [phone].   We realize that you might be concerned about having an allergic reaction to the COVID19 vaccines. To help with that concern, WE ARE OFFERING THE COVID19 VACCINES IN OUR OFFICE! Ask the front desk for dates!     "Like" [email or web address] on Facebook and Instagram for our latest updates!      A healthy democracy works best when [phone] participate! Make sure you are registered to vote! If you have moved or changed any of your contact  information, you will need to get this updated before voting!  In some cases, you MAY be able to register to vote online: [place]

## 2022-01-19 NOTE — Progress Notes (Signed)
FOLLOW UP  Date of Service/Encounter:  01/19/22   Assessment:   Seasonal and perennial allergic rhinitis   Chronic urticaria - controlled with daily cetirizine   Mild intermittent asthma, uncomplicated   GERD     Plan/Recommendations:    There are no Patient Instructions on file for this visit.   Subjective:   Cassandra Murphy is a 46 y.o. female presenting today for follow up of  Chief Complaint  Patient presents with  . Asthma    Mostly fine   . Allergic Rhinitis     Not that great. Even with the shots she is still having some moderate to severe allergy symptoms at times    Cassandra Murphy has a history of the following: Patient Active Problem List   Diagnosis Date Noted  . Overweight (BMI 25.0-29.9) 07/14/2021  . Hyperlipidemia 07/14/2021  . Furuncle of vulva 12/11/2019  . Gastroesophageal reflux disease 12/11/2019  . Mild intermittent asthma, uncomplicated 12/11/2019  . Seasonal and perennial allergic rhinitis 12/11/2019  . Chronic urticaria 12/11/2019  . Sinus pressure 11/25/2015  . Unstable ankle 11/10/2015  . Ehlers-Danlos syndrome, type 3 11/10/2015    History obtained from: chart review and {Persons; PED relatives w/patient:19415::"patient"}.  Cassandra Murphy is a 46 y.o. female presenting for {Blank single:19197::"a food challenge","a drug challenge","skin testing","a sick visit","an evaluation of ***","a follow up visit"}.  She was last seen in May 2022.  At that time, we continue with allergy shots with anticipated 5 years of shots in February 2023.  We continue with cetirizine 10 mg daily.  For her urticaria, she was doing well with cetirizine 10 mg as needed.  Asthma is under good control with albuterol as needed.  For her reflux, we changed her to pantoprazole 40 mg daily.  Since the last visit, she has done well. She went to [place] and [place]. They had a great time.    {Blank single:19197::"Asthma/Respiratory Symptom History: ***"," "}  Allergic  Rhinitis Symptom History: Shots are helping but she reports that this spring has been fairly rough. She has needed Benadryl on a few occasions during the spring in addition to her cetirizine. She is not using a nose spray. She is using her cetirzine throughout the year. This spring was miserable for her.   Cassandra Murphy is on allergen immunotherapy. She receives two injections. Immunotherapy script #1 contains dust mites. She currently receives 0.58mL of the RED vial (1/100). Immunotherapy script #2 contains trees, weeds and grasses. She currently receives 0.52mL of the RED vial (1/100). She started shots November of 2017 and reached maintenance in February of 2018.   {Blank single:19197::"Food Allergy Symptom History: ***"," "}  {Blank single:19197::"Skin Symptom History: ***"," "}  GERD Symptom History: She is on the Protonix daily and this controls her symptoms. This gets worse when she has drainage.   Otherwise, there have been no changes to her past medical history, surgical history, family history, or social history.    ROS     Objective:   Blood pressure 136/70, pulse 75, temperature 98.2 F (36.8 C), temperature source Temporal, resp. rate 16, weight 171 lb 3.2 oz (77.7 kg), SpO2 98 %. Body mass index is 26.42 kg/m.    Physical Exam   Diagnostic studies: {Blank single:19197::"none","deferred due to recent antihistamine use","labs sent instead"," "}  Spirometry: {Blank single:19197::"results normal (FEV1: ***%, FVC: ***%, FEV1/FVC: ***%)","results abnormal (FEV1: ***%, FVC: ***%, FEV1/FVC: ***%)"}.    {Blank single:19197::"Spirometry consistent with mild obstructive disease","Spirometry consistent with moderate obstructive disease","Spirometry consistent with  severe obstructive disease","Spirometry consistent with possible restrictive disease","Spirometry consistent with mixed obstructive and restrictive disease","Spirometry uninterpretable due to technique","Spirometry consistent with  normal pattern"}. {Blank single:19197::"Albuterol/Atrovent nebulizer","Xopenex/Atrovent nebulizer","Albuterol nebulizer","Albuterol four puffs via MDI","Xopenex four puffs via MDI"} treatment given in clinic with {Blank single:19197::"significant improvement in FEV1 per ATS criteria","significant improvement in FVC per ATS criteria","significant improvement in FEV1 and FVC per ATS criteria","improvement in FEV1, but not significant per ATS criteria","improvement in FVC, but not significant per ATS criteria","improvement in FEV1 and FVC, but not significant per ATS criteria","no improvement"}.  Allergy Studies: {Blank single:19197::"none","labs sent instead"," "}    {Blank single:19197::"Allergy testing results were read and interpreted by myself, documented by clinical staff."," "}      Cassandra Bonds, MD  [facility]

## 2022-01-20 ENCOUNTER — Encounter: Payer: Self-pay | Admitting: Allergy & Immunology

## 2022-01-25 NOTE — Progress Notes (Signed)
Aeroallergen Immunotherapy   Ordering Provider: Dr. Malachi Bonds   Patient Details  Name: Cassandra Murphy  MRN: [id number]  Date of Birth: May 07, 1976   Order 1 of 2   Vial Label: Pollens   0.6 ml (Volume)  BAU Concentration -- 7 Grass Mix* 100,000 ([address] [place], [place], Alex, Perennial [place], Red[place], Sweet Vernal, Timothy)  0.6 ml (Volume)  1:20 Concentration -- Ragweed Mix  0.3 ml (Volume)  1:20 Concentration -- Mugwort, Common*  0.3 ml (Volume)  1:20 Concentration -- Ash mix*  0.3 ml (Volume)  1:20 Concentration -- [place], Eastern*  0.3 ml (Volume)  1:10 Concentration -- Hickory Mix*  0.4 ml (Volume)  1:10 Concentration -- Oak, [place] mix*  0.3 ml (Volume)  1:10 Concentration -- Pecan Pollen    3.1  ml Extract Subtotal  1.9  ml Diluent  5.0  ml Maintenance Total   Schedule:  A   Red Vial (1:100): Schedule A (10 doses)   Special Instructions: Please start at [facility] Schedule A and then resume her previous buildup of 0.75mL --> 0.1mL --> 0.60mL for new vials

## 2022-01-25 NOTE — Progress Notes (Signed)
MAKE WHEN NEEDED. 

## 2022-01-26 ENCOUNTER — Ambulatory Visit: Payer: BC Managed Care – PPO

## 2022-01-26 ENCOUNTER — Telehealth: Payer: Self-pay | Admitting: Student-PharmD

## 2022-01-26 NOTE — Telephone Encounter (Signed)
Patient was scheduled with pharmacist for follow up of weight management this afternoon. Primary reason for visit was updating 6 month weight and labs as needed after started [facility] to allow for continued insurance coverage. She had a recent visit within [facility] where her weight was checked and all labs are up to date. Called patient to explain this and offered patient that we could cancel appt for this reason and just touch base over the phone but we are happy to see her in person to discuss the medication or anything else if she would like. She was grateful for the call and opted to touch base over the phone.   She reports she is still doing well on Wegovy. She has no adverse effects. Her weight at home today was 165 lb. Weight continues to slowly decline and she is happy with this weight loss and would like to continue on her current dose of 2.4 mg weekly. We will continue to refill this and submit continuation PAs as they come up. She is also still taking rosuvastatin without adverse effects. LDL improved from 162 to 74 (goal <100 mg/dL).   Baseline weight/BMI: 184 lb (BMI 28.36) Current weight/BMI: 171 lb (BMI 26.42)  = 7.1% weight loss   Follow up as needed.

## 2022-02-04 ENCOUNTER — Other Ambulatory Visit (HOSPITAL_COMMUNITY): Payer: Self-pay

## 2022-02-18 ENCOUNTER — Ambulatory Visit (INDEPENDENT_AMBULATORY_CARE_PROVIDER_SITE_OTHER): Payer: BC Managed Care – PPO

## 2022-02-18 DIAGNOSIS — J309 Allergic rhinitis, unspecified: Secondary | ICD-10-CM | POA: Diagnosis not present

## 2022-02-25 ENCOUNTER — Ambulatory Visit (INDEPENDENT_AMBULATORY_CARE_PROVIDER_SITE_OTHER): Payer: BC Managed Care – PPO | Admitting: *Deleted

## 2022-02-25 DIAGNOSIS — J309 Allergic rhinitis, unspecified: Secondary | ICD-10-CM

## 2022-03-03 ENCOUNTER — Other Ambulatory Visit (HOSPITAL_COMMUNITY): Payer: Self-pay

## 2022-03-03 ENCOUNTER — Telehealth: Payer: Self-pay | Admitting: Pharmacist

## 2022-03-03 NOTE — Telephone Encounter (Signed)
Received PA request from [facility] pharmacy for [place]. Prior authorization has been submitted.

## 2022-03-04 ENCOUNTER — Other Ambulatory Visit (HOSPITAL_COMMUNITY): Payer: Self-pay

## 2022-03-04 ENCOUNTER — Ambulatory Visit (INDEPENDENT_AMBULATORY_CARE_PROVIDER_SITE_OTHER): Payer: BC Managed Care – PPO

## 2022-03-04 DIAGNOSIS — J309 Allergic rhinitis, unspecified: Secondary | ICD-10-CM | POA: Diagnosis not present

## 2022-03-15 ENCOUNTER — Ambulatory Visit (INDEPENDENT_AMBULATORY_CARE_PROVIDER_SITE_OTHER): Payer: BC Managed Care – PPO

## 2022-03-15 ENCOUNTER — Other Ambulatory Visit (HOSPITAL_COMMUNITY): Payer: Self-pay

## 2022-03-15 DIAGNOSIS — J309 Allergic rhinitis, unspecified: Secondary | ICD-10-CM

## 2022-03-16 ENCOUNTER — Other Ambulatory Visit (HOSPITAL_COMMUNITY): Payer: Self-pay

## 2022-03-16 MED ORDER — SEMAGLUTIDE-WEIGHT MANAGEMENT 2.4 MG/0.75ML ~~LOC~~ SOAJ
2.4000 mg | SUBCUTANEOUS | 11 refills | Status: DC
  Filled 2022-03-16 – 2022-04-13 (×2): qty 3, 28d supply, fill #0
  Filled 2022-05-14: qty 3, 28d supply, fill #1
  Filled 2022-06-12: qty 3, 28d supply, fill #2
  Filled 2022-07-11: qty 3, 28d supply, fill #3
  Filled 2022-08-16: qty 3, 28d supply, fill #4
  Filled 2022-09-10: qty 3, 28d supply, fill #5
  Filled 2022-10-14: qty 3, 28d supply, fill #6
  Filled 2022-11-15: qty 3, 28d supply, fill #7
  Filled 2022-12-17: qty 3, 28d supply, fill #8

## 2022-03-16 NOTE — Telephone Encounter (Signed)
Prior auth approved through 03/03/23.

## 2022-03-22 ENCOUNTER — Ambulatory Visit (INDEPENDENT_AMBULATORY_CARE_PROVIDER_SITE_OTHER): Payer: BC Managed Care – PPO | Admitting: *Deleted

## 2022-03-22 DIAGNOSIS — J309 Allergic rhinitis, unspecified: Secondary | ICD-10-CM | POA: Diagnosis not present

## 2022-04-13 ENCOUNTER — Other Ambulatory Visit (HOSPITAL_COMMUNITY): Payer: Self-pay

## 2022-04-21 ENCOUNTER — Ambulatory Visit (INDEPENDENT_AMBULATORY_CARE_PROVIDER_SITE_OTHER): Payer: BC Managed Care – PPO | Admitting: *Deleted

## 2022-04-21 DIAGNOSIS — J309 Allergic rhinitis, unspecified: Secondary | ICD-10-CM | POA: Diagnosis not present

## 2022-05-14 ENCOUNTER — Other Ambulatory Visit (HOSPITAL_COMMUNITY): Payer: Self-pay

## 2022-05-25 ENCOUNTER — Ambulatory Visit (INDEPENDENT_AMBULATORY_CARE_PROVIDER_SITE_OTHER): Payer: BC Managed Care – PPO

## 2022-05-25 DIAGNOSIS — J309 Allergic rhinitis, unspecified: Secondary | ICD-10-CM | POA: Diagnosis not present

## 2022-06-14 ENCOUNTER — Other Ambulatory Visit (HOSPITAL_COMMUNITY): Payer: Self-pay

## 2022-06-22 DIAGNOSIS — Z130 Encounter for screening for diseases of the blood and blood-forming organs and certain disorders involving the immune mechanism: Secondary | ICD-10-CM | POA: Diagnosis not present

## 2022-06-22 DIAGNOSIS — Z1389 Encounter for screening for other disorder: Secondary | ICD-10-CM | POA: Diagnosis not present

## 2022-06-22 DIAGNOSIS — Z6823 Body mass index (BMI) 23.0-23.9, adult: Secondary | ICD-10-CM | POA: Diagnosis not present

## 2022-06-22 DIAGNOSIS — N898 Other specified noninflammatory disorders of vagina: Secondary | ICD-10-CM | POA: Diagnosis not present

## 2022-06-22 DIAGNOSIS — Z01419 Encounter for gynecological examination (general) (routine) without abnormal findings: Secondary | ICD-10-CM | POA: Diagnosis not present

## 2022-06-23 ENCOUNTER — Ambulatory Visit (INDEPENDENT_AMBULATORY_CARE_PROVIDER_SITE_OTHER): Payer: BC Managed Care – PPO | Admitting: *Deleted

## 2022-06-23 DIAGNOSIS — J309 Allergic rhinitis, unspecified: Secondary | ICD-10-CM

## 2022-06-23 DIAGNOSIS — H04123 Dry eye syndrome of bilateral lacrimal glands: Secondary | ICD-10-CM | POA: Diagnosis not present

## 2022-06-23 DIAGNOSIS — H5213 Myopia, bilateral: Secondary | ICD-10-CM | POA: Diagnosis not present

## 2022-07-12 ENCOUNTER — Other Ambulatory Visit (HOSPITAL_COMMUNITY): Payer: Self-pay

## 2022-07-16 ENCOUNTER — Ambulatory Visit (INDEPENDENT_AMBULATORY_CARE_PROVIDER_SITE_OTHER): Payer: BC Managed Care – PPO | Admitting: *Deleted

## 2022-07-16 DIAGNOSIS — J309 Allergic rhinitis, unspecified: Secondary | ICD-10-CM | POA: Diagnosis not present

## 2022-07-22 DIAGNOSIS — J3089 Other allergic rhinitis: Secondary | ICD-10-CM

## 2022-07-22 NOTE — Progress Notes (Signed)
VIALS EXP 07-23-23 

## 2022-08-17 ENCOUNTER — Other Ambulatory Visit (HOSPITAL_COMMUNITY): Payer: Self-pay

## 2022-08-23 ENCOUNTER — Other Ambulatory Visit (HOSPITAL_COMMUNITY): Payer: Self-pay | Admitting: *Deleted

## 2022-08-23 DIAGNOSIS — E782 Mixed hyperlipidemia: Secondary | ICD-10-CM

## 2022-08-24 ENCOUNTER — Telehealth (HOSPITAL_COMMUNITY): Payer: Self-pay | Admitting: Vascular Surgery

## 2022-08-24 DIAGNOSIS — E782 Mixed hyperlipidemia: Secondary | ICD-10-CM

## 2022-08-24 NOTE — Telephone Encounter (Signed)
Pt called for refill Rosuvastatin

## 2022-08-25 MED ORDER — ROSUVASTATIN CALCIUM 10 MG PO TABS: 10.0000 mg | ORAL_TABLET | Freq: Every day | ORAL | 3 refills | Status: AC

## 2022-08-25 NOTE — Telephone Encounter (Signed)
Pt has never been seen by our Cardiologist, but pt has been seen by the pharmacist. Please address

## 2022-08-25 NOTE — Telephone Encounter (Signed)
Refill sent in

## 2022-08-31 ENCOUNTER — Ambulatory Visit (INDEPENDENT_AMBULATORY_CARE_PROVIDER_SITE_OTHER): Payer: BC Managed Care – PPO

## 2022-08-31 DIAGNOSIS — J309 Allergic rhinitis, unspecified: Secondary | ICD-10-CM | POA: Diagnosis not present

## 2022-09-29 ENCOUNTER — Ambulatory Visit (INDEPENDENT_AMBULATORY_CARE_PROVIDER_SITE_OTHER): Payer: BC Managed Care – PPO

## 2022-09-29 DIAGNOSIS — J309 Allergic rhinitis, unspecified: Secondary | ICD-10-CM | POA: Diagnosis not present

## 2022-10-28 ENCOUNTER — Ambulatory Visit (INDEPENDENT_AMBULATORY_CARE_PROVIDER_SITE_OTHER): Payer: BC Managed Care – PPO

## 2022-10-28 DIAGNOSIS — J309 Allergic rhinitis, unspecified: Secondary | ICD-10-CM

## 2022-11-22 ENCOUNTER — Ambulatory Visit (INDEPENDENT_AMBULATORY_CARE_PROVIDER_SITE_OTHER): Payer: BC Managed Care – PPO

## 2022-11-22 DIAGNOSIS — J309 Allergic rhinitis, unspecified: Secondary | ICD-10-CM

## 2022-11-29 ENCOUNTER — Ambulatory Visit (INDEPENDENT_AMBULATORY_CARE_PROVIDER_SITE_OTHER): Payer: BC Managed Care – PPO

## 2022-11-29 DIAGNOSIS — J309 Allergic rhinitis, unspecified: Secondary | ICD-10-CM

## 2022-12-07 ENCOUNTER — Ambulatory Visit (INDEPENDENT_AMBULATORY_CARE_PROVIDER_SITE_OTHER): Payer: BC Managed Care – PPO

## 2022-12-07 DIAGNOSIS — J309 Allergic rhinitis, unspecified: Secondary | ICD-10-CM | POA: Diagnosis not present

## 2022-12-15 ENCOUNTER — Ambulatory Visit (INDEPENDENT_AMBULATORY_CARE_PROVIDER_SITE_OTHER): Payer: BC Managed Care – PPO

## 2022-12-15 DIAGNOSIS — J309 Allergic rhinitis, unspecified: Secondary | ICD-10-CM

## 2022-12-17 ENCOUNTER — Other Ambulatory Visit (HOSPITAL_COMMUNITY): Payer: Self-pay

## 2022-12-23 ENCOUNTER — Ambulatory Visit (INDEPENDENT_AMBULATORY_CARE_PROVIDER_SITE_OTHER): Payer: BC Managed Care – PPO

## 2022-12-23 DIAGNOSIS — J309 Allergic rhinitis, unspecified: Secondary | ICD-10-CM | POA: Diagnosis not present

## 2023-01-07 ENCOUNTER — Other Ambulatory Visit: Payer: Self-pay

## 2023-01-07 MED ORDER — SEMAGLUTIDE-WEIGHT MANAGEMENT 2.4 MG/0.75ML ~~LOC~~ SOAJ
2.4000 mg | SUBCUTANEOUS | 11 refills | Status: DC
Start: 2023-01-07 — End: 2023-12-14

## 2023-01-07 NOTE — Telephone Encounter (Signed)
Pt calling requesting medication semaglutide be sent to a different pharmacy. Please address

## 2023-01-20 ENCOUNTER — Encounter: Payer: Self-pay | Admitting: Internal Medicine

## 2023-01-20 ENCOUNTER — Ambulatory Visit: Payer: BC Managed Care – PPO | Admitting: Internal Medicine

## 2023-01-20 VITALS — BP 124/80 | HR 69 | Temp 98.4°F | Ht 67.0 in | Wt 143.0 lb

## 2023-01-20 DIAGNOSIS — K219 Gastro-esophageal reflux disease without esophagitis: Secondary | ICD-10-CM

## 2023-01-20 DIAGNOSIS — Q7962 Hypermobile Ehlers-Danlos syndrome: Secondary | ICD-10-CM | POA: Diagnosis not present

## 2023-01-20 DIAGNOSIS — E782 Mixed hyperlipidemia: Secondary | ICD-10-CM

## 2023-01-20 DIAGNOSIS — Z0000 Encounter for general adult medical examination without abnormal findings: Secondary | ICD-10-CM

## 2023-01-20 DIAGNOSIS — L508 Other urticaria: Secondary | ICD-10-CM | POA: Diagnosis not present

## 2023-01-20 DIAGNOSIS — J4520 Mild intermittent asthma, uncomplicated: Secondary | ICD-10-CM

## 2023-01-20 NOTE — Progress Notes (Signed)
   Subjective:   Patient ID: Cassandra Murphy, female    DOB: [birth year], 47 y.o.   MRN: [id number]  HPI The patient is a new 47 YO female coming in for physical.  PMH, FMH, social history reviewed and updated  Review of Systems  Constitutional: Negative.   HENT: Negative.    Eyes: Negative.   Respiratory:  Negative for cough, chest tightness and shortness of breath.   Cardiovascular:  Negative for chest pain, palpitations and leg swelling.  Gastrointestinal:  Negative for abdominal distention, abdominal pain, constipation, diarrhea, nausea and vomiting.  Musculoskeletal:  Positive for myalgias.  Skin: Negative.   Neurological: Negative.   Psychiatric/Behavioral: Negative.      Objective:  Physical Exam Constitutional:      Appearance: She is well-developed.  HENT:     Head: Normocephalic and atraumatic.  Cardiovascular:     Rate and Rhythm: Normal rate and regular rhythm.  Pulmonary:     Effort: Pulmonary effort is normal. No respiratory distress.     Breath sounds: Normal breath sounds. No wheezing or rales.  Abdominal:     General: Bowel sounds are normal. There is no distension.     Palpations: Abdomen is soft.     Tenderness: There is no abdominal tenderness. There is no rebound.  Musculoskeletal:     Cervical back: Normal range of motion.  Skin:    General: Skin is warm and dry.  Neurological:     Mental Status: She is alert and oriented to person, place, and time.     Coordination: Coordination normal.     Vitals:   01/20/23 0820  BP: 124/80  Pulse: 69  Temp: 98.4 F (36.9 C)  TempSrc: Oral  SpO2: 99%  Weight: 143 lb (64.9 kg)  Height: 5\' 7"  (1.702 m)    Assessment & Plan:

## 2023-01-20 NOTE — Patient Instructions (Addendum)
Think about a tetanus shot.

## 2023-01-21 ENCOUNTER — Ambulatory Visit (INDEPENDENT_AMBULATORY_CARE_PROVIDER_SITE_OTHER): Payer: BC Managed Care – PPO

## 2023-01-21 DIAGNOSIS — J309 Allergic rhinitis, unspecified: Secondary | ICD-10-CM | POA: Diagnosis not present

## 2023-01-21 DIAGNOSIS — Z0000 Encounter for general adult medical examination without abnormal findings: Secondary | ICD-10-CM | POA: Insufficient documentation

## 2023-01-21 NOTE — Assessment & Plan Note (Signed)
Rare albuterol and she has access to one but has not used in last few months.

## 2023-01-21 NOTE — Assessment & Plan Note (Signed)
Taking protonix 40 mg daily and can refill if/when needed.

## 2023-01-21 NOTE — Assessment & Plan Note (Signed)
She is cautious about activities and overall stable for now.

## 2023-01-21 NOTE — Assessment & Plan Note (Signed)
Flu shot yearly. Tetanus due. Colonoscopy up to date. Mammogram up to date with gyn, pap smear up to date with gyn. Counseled about sun safety and mole surveillance. Counseled about the dangers of distracted driving. Given 10 year screening recommendations.

## 2023-01-21 NOTE — Assessment & Plan Note (Signed)
Taking crestor 10 mg daily and at goal with prior labs. No labs needed today.

## 2023-01-21 NOTE — Assessment & Plan Note (Signed)
Does allergy shots and overall satisfied with control.

## 2023-02-18 ENCOUNTER — Ambulatory Visit (INDEPENDENT_AMBULATORY_CARE_PROVIDER_SITE_OTHER): Payer: BC Managed Care – PPO

## 2023-02-18 DIAGNOSIS — J309 Allergic rhinitis, unspecified: Secondary | ICD-10-CM

## 2023-03-07 ENCOUNTER — Other Ambulatory Visit: Payer: Self-pay | Admitting: *Deleted

## 2023-03-08 ENCOUNTER — Other Ambulatory Visit (HOSPITAL_COMMUNITY): Payer: Self-pay

## 2023-03-08 ENCOUNTER — Telehealth: Payer: Self-pay

## 2023-03-08 NOTE — Telephone Encounter (Signed)
Pharmacy Patient Advocate Encounter   Received notification from CoverMyMeds that prior authorization for [facility] is required/requested.   Insurance verification completed.   The patient is insured through [facility] .   Per test claim: PA required; PA submitted to [employer] via CoverMyMeds Key/confirmation #/EOC  [facility]      Status is pending

## 2023-03-09 ENCOUNTER — Other Ambulatory Visit (HOSPITAL_COMMUNITY): Payer: Self-pay

## 2023-03-11 ENCOUNTER — Other Ambulatory Visit (HOSPITAL_COMMUNITY): Payer: Self-pay

## 2023-03-11 NOTE — Telephone Encounter (Signed)
Pharmacy Patient Advocate Encounter  Received notification from [facility] that Prior Authorization for WEGOVY 2.4 MG has been  APPROVED UNTIL 7.30.25  PA #/Case ID/Reference #: [id number]

## 2023-03-11 NOTE — Telephone Encounter (Signed)
Called [employer] for a follow up on a determination. I was informed the decision would be made tomorrow/ over the weekend on 8/3

## 2023-03-30 ENCOUNTER — Emergency Department (HOSPITAL_COMMUNITY)
Admission: EM | Admit: 2023-03-30 | Discharge: 2023-03-30 | Disposition: A | Discharge status: Home / Self Care | Payer: BC Managed Care – PPO | Attending: Emergency Medicine | Admitting: Emergency Medicine

## 2023-03-30 ENCOUNTER — Other Ambulatory Visit: Payer: Self-pay

## 2023-03-30 ENCOUNTER — Emergency Department (HOSPITAL_COMMUNITY): Payer: BC Managed Care – PPO

## 2023-03-30 ENCOUNTER — Encounter (HOSPITAL_COMMUNITY): Payer: Self-pay

## 2023-03-30 DIAGNOSIS — S93402A Sprain of unspecified ligament of left ankle, initial encounter: Secondary | ICD-10-CM | POA: Diagnosis not present

## 2023-03-30 DIAGNOSIS — M7732 Calcaneal spur, left foot: Secondary | ICD-10-CM | POA: Diagnosis not present

## 2023-03-30 DIAGNOSIS — Y9373 Activity, racquet and hand sports: Secondary | ICD-10-CM | POA: Insufficient documentation

## 2023-03-30 DIAGNOSIS — M7989 Other specified soft tissue disorders: Secondary | ICD-10-CM | POA: Insufficient documentation

## 2023-03-30 DIAGNOSIS — S99919A Unspecified injury of unspecified ankle, initial encounter: Secondary | ICD-10-CM | POA: Diagnosis not present

## 2023-03-30 DIAGNOSIS — S93492A Sprain of other ligament of left ankle, initial encounter: Secondary | ICD-10-CM | POA: Diagnosis not present

## 2023-03-30 DIAGNOSIS — Z794 Long term (current) use of insulin: Secondary | ICD-10-CM | POA: Insufficient documentation

## 2023-03-30 DIAGNOSIS — X501XXA Overexertion from prolonged static or awkward postures, initial encounter: Secondary | ICD-10-CM | POA: Insufficient documentation

## 2023-03-30 DIAGNOSIS — S99912A Unspecified injury of left ankle, initial encounter: Secondary | ICD-10-CM | POA: Diagnosis not present

## 2023-03-30 DIAGNOSIS — M25572 Pain in left ankle and joints of left foot: Secondary | ICD-10-CM | POA: Diagnosis not present

## 2023-03-30 MED ORDER — OXYCODONE-ACETAMINOPHEN 5-325 MG PO TABS
1.0000 | ORAL_TABLET | Freq: Once | ORAL | Status: AC
  Administered 2023-03-30: 1 via ORAL
  Filled 2023-03-30: qty 1

## 2023-03-30 NOTE — Discharge Instructions (Signed)
You were seen in the ER today an ankle injury. Your xray did not show signs of any fractures in the ankle. You likely have a bad sprain that will require you to be in an ankle brace for a few weeks for added support. I would suggest following up with your primary care provider for repeat evaluation as needed. Since you have previously seen orthopedics for this, you may follow up with them if needed as well. Take ibuprofen or naproxen for pain as needed. You could also alternate with Tylenol for additional pain control if needed.

## 2023-03-30 NOTE — ED Provider Notes (Signed)
[place] EMERGENCY DEPARTMENT AT [facility] Provider Note   CSN: [id number] Arrival date & time: 03/30/23  1847     History Chief Complaint  Patient presents with   Ankle Pain    Cassandra Murphy is a 47 y.o. female. Patient presents to the ED with concerns of ankle pain. States that she was playing tennis when she rolled her left ankle and was unable to bear weight. Reports "hypermobile joints" and prior ankle sprains, but not currently being managed by orthopedics or other specialist for this. States that she still has complete sensory and motor function, but pain is significant. Was brought in by EMS and given of fentanyl which appeared to control pain well. No other medications prior to arrival. Denies any pain elsewhere. No head strike or other area acute injury.   Ankle Pain      Home Medications Prior to Admission medications   Medication Sig Start Date End Date Taking? Authorizing Provider  albuterol (VENTOLIN HFA) 108 (90 Base) MCG/ACT inhaler Inhale 2 puffs into the lungs every 6 (six) hours as needed for wheezing or shortness of breath. 01/19/22   Alfonse Spruce, MD  cetirizine (ZYRTEC) 10 MG tablet Take 10 mg by mouth daily.    [provider]  EPINEPHrine 0.3 mg/0.3 mL IJ SOAJ injection Inject 0.3 mg into the muscle as needed for anaphylaxis. 12/16/20   Alfonse Spruce, MD  ibuprofen (ADVIL,MOTRIN) 200 MG tablet Take 200 mg by mouth as needed.     [provider]  ipratropium (ATROVENT) 0.03 % nasal spray Place 2 sprays into both nostrils 3 (three) times daily. 01/19/22   Alfonse Spruce, MD  levonorgestrel (MIRENA, 52 MG,) 20 MCG/24HR IUD Mirena 20 mcg/24 hours (6 yrs) 52 mg intrauterine device  Take 1 device by intrauterine route.    [provider]  pantoprazole (PROTONIX) 40 MG tablet TAKE 1 TABLET(40 MG) BY MOUTH DAILY 01/19/22   Alfonse Spruce, MD  rosuvastatin (CRESTOR) 10 MG tablet Take 1  tablet (10 mg total) by mouth daily. 08/25/22   Laurey Morale, MD  Semaglutide-Weight Management 2.4 MG/0.75ML SOAJ Inject 2.4 mg into the skin once a week. 01/07/23   Laurey Morale, MD      Allergies    Codeine    Review of Systems   Review of Systems  Musculoskeletal:  Positive for joint swelling.  All other systems reviewed and are negative.   Physical Exam Updated Vital Signs BP 131/88   Pulse 83   Temp 98.4 F (36.9 C) (Oral)   Resp 16   SpO2 99%  Physical Exam Vitals and nursing note reviewed.  HENT:     Head: Normocephalic and atraumatic.  Eyes:     General: No scleral icterus.       Right eye: No discharge.        Left eye: No discharge.  Cardiovascular:     Rate and Rhythm: Normal rate and regular rhythm.  Musculoskeletal:        General: Swelling, tenderness and signs of injury present. No deformity. Normal range of motion.     Comments: TTP along the lateral malleolus of the left ankle  Skin:    General: Skin is warm and dry.     Findings: No bruising or rash.     ED Results / Procedures / Treatments   Labs (all labs ordered are listed, but only abnormal results are displayed) Labs Reviewed - No data to  display  EKG None  Radiology DG Ankle Complete Left  Result Date: 03/30/2023 CLINICAL DATA:  Fall, landed wrong on left ankle. Pain and tenderness. EXAM: LEFT ANKLE COMPLETE - 3+ VIEW COMPARISON:  None Available. FINDINGS: No fracture or dislocation. Limited assessment for joint effusion on the lateral due to positioning. The base of the fifth metatarsal is intact. Ankle mortise is congruent. Talar dome is intact. Small corticated density adjacent to the distal fibular tip likely sequela of remote injury. There is a plantar calcaneal spur. No focal soft tissue abnormalities. IMPRESSION: 1. No acute fracture or dislocation of the left ankle. 2. Plantar calcaneal spur. Electronically Signed   By: Narda Rutherford M.D.   On: 03/30/2023 19:45     Procedures Procedures   Medications Ordered in ED Medications  oxyCODONE-acetaminophen (PERCOCET/ROXICET) 5-325 MG per tablet 1 tablet (1 tablet Oral Given 03/30/23 2013)    ED Course/ Medical Decision Making/ A&P                               Medical Decision Making Amount and/or Complexity of Data Reviewed Radiology: ordered.  Risk Prescription drug management.   This patient presents to the ED for concern of ankle pain.  Differential diagnosis includes malleolus fracture, ankle dislocation, ankle sprain, contusion, arthritis    Imaging Studies ordered:  I ordered imaging studies including left ankle x-ray I independently visualized and interpreted imaging which showed no evidence of any bony abnormality I agree with the radiologist interpretation   Medicines ordered and prescription drug management:  I ordered medication including Percocet for pain Reevaluation of the patient after these medicines showed that the patient improved I have reviewed the patients home medicines and have made adjustments as needed   Problem List / ED Course:  Patient presented to the emergency department concerns of ankle pain.  Reports that she was playing tennis when she jumped and rolled her ankle.  Unable to bear weight immediately after the injury.  Reports prior history of hypermobility of her joints and prior ankle sprains but no prior ankle fractures.  No recent or prior surgeries in this area.  Patient has a splint in place prior to arriving brought in by EMS. X-ray imaging ordered for evaluation of patient's symptoms.  Given that there is tenderness to the lateral malleolus, and patient unable to bear weight immediately after injury, patient meeting criteria for [place] ankle rules. X-ray reassuring with no signs of any fractures, dislocations, or other injury.  Informed patient of reassuring findings will place patient into an ASO lace up brace for continued support of the ankle.   She reports that she has crutches at home will use those and does not feel that she needs any here from the hospital.  Advised following up with primary care provider/orthopedics for repeat evaluation if symptoms not improving in the next week or 2.  May require repeat imaging for possibility of occult fracture.  Encouraged pain control with over-the-counter medications such as Tylenol, ibuprofen, or Aleve. No acute indication for admission at this time. Strict return precautions discussed. All questions answered prior to patient discharge.  Final Clinical Impression(s) / ED Diagnoses Final diagnoses:  Sprain of left ankle, unspecified ligament, initial encounter    Rx / DC Orders ED Discharge Orders     None         Smitty Knudsen, PA-C 03/31/23 1512    Gloris Manchester, MD 04/05/23 [phone]

## 2023-03-30 NOTE — Progress Notes (Signed)
Orthopedic Tech Progress Note Patient Details:  Cassandra Murphy 03-18-1976 [id number]  Ortho Devices Type of Ortho Device: ASO, Crutches Ortho Device/Splint Location: left aso. crutches sized  and instructed on use Ortho Device/Splint Interventions: Ordered, Application, Adjustment   Post Interventions Patient Tolerated: Fair Instructions Provided: Care of device, Adjustment of device  Kizzie Fantasia 03/30/2023, 8:38 PM

## 2023-03-30 NOTE — ED Triage Notes (Signed)
[place], pt jumped while playing tennis, landed wrong on the left ankle. Pt now endorses pain, and tenderness. Hx of sprains, no obvious deformity. 100 mcg of fentanyl given by EMS.

## 2023-04-05 ENCOUNTER — Telehealth: Payer: Self-pay | Admitting: *Deleted

## 2023-04-05 ENCOUNTER — Encounter: Payer: Self-pay | Admitting: *Deleted

## 2023-04-05 NOTE — Transitions of Care (Post Inpatient/ED Visit) (Signed)
   04/05/2023  Name: Cassandra Murphy MRN: [id number] DOB: 1975-11-26  Today's TOC FU Call Status: Today's TOC FU Call Status:: Unsuccessful Call (1st Attempt) Unsuccessful Call (1st Attempt) Date: 04/05/23  ED EMMI Red Alert notification on 04/04/23 from ED visit 03/30/23- automated EMMI call placed 04/01/23: "No discharge instructions"  Attempted to reach the patient regarding the most recent ED visit; left HIPAA compliant voice message requesting call back  Follow Up Plan: Additional outreach attempts will be made to reach the patient to complete the Transitions of Care (Post ED visit) call.   Caryl Pina, RN, BSN, CCRN Alumnus RN CM Care Coordination/ Transition of Care- [facility] Care Management [phone]: direct office

## 2023-04-06 ENCOUNTER — Telehealth: Payer: Self-pay | Admitting: *Deleted

## 2023-04-06 ENCOUNTER — Encounter: Payer: Self-pay | Admitting: *Deleted

## 2023-04-06 ENCOUNTER — Ambulatory Visit: Payer: Self-pay | Admitting: *Deleted

## 2023-04-06 DIAGNOSIS — M79672 Pain in left foot: Secondary | ICD-10-CM | POA: Diagnosis not present

## 2023-04-06 DIAGNOSIS — J309 Allergic rhinitis, unspecified: Secondary | ICD-10-CM

## 2023-04-06 DIAGNOSIS — S93492A Sprain of other ligament of left ankle, initial encounter: Secondary | ICD-10-CM | POA: Diagnosis not present

## 2023-04-06 NOTE — Transitions of Care (Post Inpatient/ED Visit) (Signed)
04/06/2023  Name: Cassandra Murphy MRN: [id number] DOB: 1976-07-09  Today's TOC FU Call Status: Today's TOC FU Call Status:: Successful TOC FU Call Completed TOC FU Call Complete Date: 04/06/23 Patient's Name and Date of Birth confirmed.  ED EMMI Red Alert notification on 04/04/23 from ED visit 03/30/23- automated EMMI call placed 04/01/23: "No discharge instructions"    Transition Care Management Follow-up Telephone Call Date of Discharge: 03/30/23 Discharge Facility: [facility]) Type of Discharge: Emergency Department Reason for ED Visit: Other: (ankle sprain without fracture) How have you been since you were released from the hospital?: Better ("I finally went to [facility] today-- I am in a walking boot and using a scooter; I will go back in 2 weeks for a re-check.  I don't need to see Dr. Okey Dupre.  Thanks for reviewing these instructions with me") Any questions or concerns?: No  Items Reviewed: Did you receive and understand the discharge instructions provided?: No (patient reported she is unsure if she received discharge instructions-- thoroughly reviewed with patient who verbalizes good understanding of same after our review) Medications obtained,verified, and reconciled?: No Medications Not Reviewed Reasons:: Other: (Patient declined full medication review) Any new allergies since your discharge?: No Dietary orders reviewed?: No Do you have support at home?: Yes People in Home: spouse Name of Support/Comfort Primary Source: Reports independent in self-care activities at baseline; supportive spouse assists as/ if needed/ indicated post- recent ankle injury  Medications Reviewed Today: Medications Reviewed Today     Reviewed by Michaela Corner, RN (Registered Nurse) on 04/06/23 at 1201  Med List Status: <None>   Medication Order Taking? Sig Documenting Provider Last Dose Status Informant  albuterol (VENTOLIN HFA) 108 (90 Base) MCG/ACT inhaler [id number] No Inhale 2  puffs into the lungs every 6 (six) hours as needed for wheezing or shortness of breath. Alfonse Spruce, MD Taking Active            Med Note Otelia Limes Apr 06, 2023 12:01 PM) 04/06/23: declined full medication review during TOC call today  cetirizine (ZYRTEC) 10 MG tablet [id number] No Take 10 mg by mouth daily. [provider] Taking Active   EPINEPHrine 0.3 mg/0.3 mL IJ SOAJ injection [id number] No Inject 0.3 mg into the muscle as needed for anaphylaxis. Alfonse Spruce, MD Taking Active   ibuprofen (ADVIL,MOTRIN) 200 MG tablet [id number] No Take 200 mg by mouth as needed.  [provider] Taking Active   ipratropium (ATROVENT) 0.03 % nasal spray [id number] No Place 2 sprays into both nostrils 3 (three) times daily. Alfonse Spruce, MD Taking Active   levonorgestrel (MIRENA, 52 MG,) 20 MCG/24HR IUD [id number] No Mirena 20 mcg/24 hours (6 yrs) 52 mg intrauterine device  Take 1 device by intrauterine route. [provider] Taking Active   pantoprazole (PROTONIX) 40 MG tablet [id number] No TAKE 1 TABLET(40 MG) BY MOUTH DAILY Alfonse Spruce, MD Taking Active   rosuvastatin (CRESTOR) 10 MG tablet [id number] No Take 1 tablet (10 mg total) by mouth daily. Laurey Morale, MD Taking Active   Semaglutide-Weight Management 2.4 MG/0.75ML Ivory Broad [id number] No Inject 2.4 mg into the skin once a week. Laurey Morale, MD Taking Active            Home Care and Equipment/Supplies: Were Home Health Services Ordered?: NA Any new equipment or medical supplies ordered?: NA  Functional Questionnaire: Do you need assistance with bathing/showering or dressing?: Yes (husband assisting as indicated after  recent ankle injury) Do you need assistance with meal preparation?: No Do you need assistance with eating?: No Do you have difficulty maintaining continence: No Do you need assistance with getting out of bed/getting out of a chair/moving?: No Do you have  difficulty managing or taking your medications?: No  Follow up appointments reviewed: PCP Follow-up appointment confirmed?: NA (verified not indicated per hospital discharging provider discharge notes) Specialist Hospital Follow-up appointment confirmed?: Yes Date of Specialist follow-up appointment?: 04/06/23 (confirmed attended orthopedic provider office visit today) Follow-Up Specialty Provider:: [facility] Do you need transportation to your follow-up appointment?: No Do you understand care options if your condition(s) worsen?: Yes-patient verbalized understanding  SDOH Interventions Today    Flowsheet Row Most Recent Value  SDOH Interventions   Food Insecurity Interventions Intervention Not Indicated  Transportation Interventions Intervention Not Indicated  [normally drives self,  husband assisiting after recent ankle injury]      TOC Interventions Today    Flowsheet Row Most Recent Value  TOC Interventions   TOC Interventions Discussed/Reviewed TOC Interventions Discussed, Post discharge activity limitations per provider      Interventions Today    Flowsheet Row Most Recent Value  Chronic Disease   Chronic disease during today's visit Other  [ankle injury]  General Interventions   General Interventions Discussed/Reviewed General Interventions Discussed, Doctor Visits, Durable Medical Equipment (DME)  Doctor Visits Discussed/Reviewed Doctor Visits Discussed, Specialist, PCP  Durable Medical Equipment (DME) Other  [confirmed using walking boot and electric scooter post-recent ankle injury]  PCP/Specialist Visits Compliance with follow-up visit  Exercise Interventions   Exercise Discussed/Reviewed Exercise Discussed  [reports she is to gradually increase weight bearing/ conservative activity: this was encouraged]  Nutrition Interventions   Nutrition Discussed/Reviewed Nutrition Discussed  Pharmacy Interventions   Pharmacy Dicussed/Reviewed Pharmacy Topics Discussed   Safety Interventions   Safety Discussed/Reviewed Safety Discussed, Fall Risk      Caryl Pina, RN, BSN, CCRN Alumnus RN CM Care Coordination/ Transition of Care- [facility] Care Management [phone]: direct office

## 2023-04-20 DIAGNOSIS — S93492D Sprain of other ligament of left ankle, subsequent encounter: Secondary | ICD-10-CM | POA: Diagnosis not present

## 2023-04-21 DIAGNOSIS — R269 Unspecified abnormalities of gait and mobility: Secondary | ICD-10-CM | POA: Diagnosis not present

## 2023-04-21 DIAGNOSIS — M79672 Pain in left foot: Secondary | ICD-10-CM | POA: Diagnosis not present

## 2023-04-26 DIAGNOSIS — R269 Unspecified abnormalities of gait and mobility: Secondary | ICD-10-CM | POA: Diagnosis not present

## 2023-04-26 DIAGNOSIS — M79672 Pain in left foot: Secondary | ICD-10-CM | POA: Diagnosis not present

## 2023-05-05 ENCOUNTER — Ambulatory Visit (INDEPENDENT_AMBULATORY_CARE_PROVIDER_SITE_OTHER): Payer: BC Managed Care – PPO

## 2023-05-05 DIAGNOSIS — J309 Allergic rhinitis, unspecified: Secondary | ICD-10-CM | POA: Diagnosis not present

## 2023-05-05 DIAGNOSIS — R269 Unspecified abnormalities of gait and mobility: Secondary | ICD-10-CM | POA: Diagnosis not present

## 2023-05-05 DIAGNOSIS — M79672 Pain in left foot: Secondary | ICD-10-CM | POA: Diagnosis not present

## 2023-05-11 DIAGNOSIS — S93492A Sprain of other ligament of left ankle, initial encounter: Secondary | ICD-10-CM | POA: Diagnosis not present

## 2023-05-11 DIAGNOSIS — M79672 Pain in left foot: Secondary | ICD-10-CM | POA: Diagnosis not present

## 2023-05-11 DIAGNOSIS — R269 Unspecified abnormalities of gait and mobility: Secondary | ICD-10-CM | POA: Diagnosis not present

## 2023-05-18 DIAGNOSIS — M79672 Pain in left foot: Secondary | ICD-10-CM | POA: Diagnosis not present

## 2023-05-18 DIAGNOSIS — R269 Unspecified abnormalities of gait and mobility: Secondary | ICD-10-CM | POA: Diagnosis not present

## 2023-05-25 DIAGNOSIS — R269 Unspecified abnormalities of gait and mobility: Secondary | ICD-10-CM | POA: Diagnosis not present

## 2023-05-25 DIAGNOSIS — M79672 Pain in left foot: Secondary | ICD-10-CM | POA: Diagnosis not present

## 2023-05-31 DIAGNOSIS — R269 Unspecified abnormalities of gait and mobility: Secondary | ICD-10-CM | POA: Diagnosis not present

## 2023-05-31 DIAGNOSIS — M79672 Pain in left foot: Secondary | ICD-10-CM | POA: Diagnosis not present

## 2023-06-06 ENCOUNTER — Ambulatory Visit (INDEPENDENT_AMBULATORY_CARE_PROVIDER_SITE_OTHER): Payer: Self-pay

## 2023-06-06 DIAGNOSIS — J309 Allergic rhinitis, unspecified: Secondary | ICD-10-CM | POA: Diagnosis not present

## 2023-06-07 DIAGNOSIS — K080 Exfoliation of teeth due to systemic causes: Secondary | ICD-10-CM | POA: Diagnosis not present

## 2023-06-07 DIAGNOSIS — J301 Allergic rhinitis due to pollen: Secondary | ICD-10-CM | POA: Diagnosis not present

## 2023-06-07 NOTE — Progress Notes (Signed)
EXP 06/06/24

## 2023-06-08 DIAGNOSIS — R269 Unspecified abnormalities of gait and mobility: Secondary | ICD-10-CM | POA: Diagnosis not present

## 2023-06-08 DIAGNOSIS — M79672 Pain in left foot: Secondary | ICD-10-CM | POA: Diagnosis not present

## 2023-06-10 DIAGNOSIS — S93492A Sprain of other ligament of left ankle, initial encounter: Secondary | ICD-10-CM | POA: Diagnosis not present

## 2023-06-15 DIAGNOSIS — M79672 Pain in left foot: Secondary | ICD-10-CM | POA: Diagnosis not present

## 2023-06-15 DIAGNOSIS — R269 Unspecified abnormalities of gait and mobility: Secondary | ICD-10-CM | POA: Diagnosis not present

## 2023-06-22 DIAGNOSIS — R269 Unspecified abnormalities of gait and mobility: Secondary | ICD-10-CM | POA: Diagnosis not present

## 2023-06-22 DIAGNOSIS — M79672 Pain in left foot: Secondary | ICD-10-CM | POA: Diagnosis not present

## 2023-06-29 DIAGNOSIS — Z01419 Encounter for gynecological examination (general) (routine) without abnormal findings: Secondary | ICD-10-CM | POA: Diagnosis not present

## 2023-06-29 DIAGNOSIS — Z1231 Encounter for screening mammogram for malignant neoplasm of breast: Secondary | ICD-10-CM | POA: Diagnosis not present

## 2023-06-29 DIAGNOSIS — Z30431 Encounter for routine checking of intrauterine contraceptive device: Secondary | ICD-10-CM | POA: Diagnosis not present

## 2023-07-05 ENCOUNTER — Ambulatory Visit (INDEPENDENT_AMBULATORY_CARE_PROVIDER_SITE_OTHER): Payer: BC Managed Care – PPO

## 2023-07-05 DIAGNOSIS — J309 Allergic rhinitis, unspecified: Secondary | ICD-10-CM

## 2023-07-13 DIAGNOSIS — M79672 Pain in left foot: Secondary | ICD-10-CM | POA: Diagnosis not present

## 2023-07-13 DIAGNOSIS — R531 Weakness: Secondary | ICD-10-CM | POA: Diagnosis not present

## 2023-07-13 DIAGNOSIS — S82892D Other fracture of left lower leg, subsequent encounter for closed fracture with routine healing: Secondary | ICD-10-CM | POA: Diagnosis not present

## 2023-07-13 DIAGNOSIS — R269 Unspecified abnormalities of gait and mobility: Secondary | ICD-10-CM | POA: Diagnosis not present

## 2023-07-27 DIAGNOSIS — M79672 Pain in left foot: Secondary | ICD-10-CM | POA: Diagnosis not present

## 2023-07-27 DIAGNOSIS — S82892D Other fracture of left lower leg, subsequent encounter for closed fracture with routine healing: Secondary | ICD-10-CM | POA: Diagnosis not present

## 2023-07-27 DIAGNOSIS — R531 Weakness: Secondary | ICD-10-CM | POA: Diagnosis not present

## 2023-07-27 DIAGNOSIS — R269 Unspecified abnormalities of gait and mobility: Secondary | ICD-10-CM | POA: Diagnosis not present

## 2023-07-29 ENCOUNTER — Ambulatory Visit (INDEPENDENT_AMBULATORY_CARE_PROVIDER_SITE_OTHER): Payer: BC Managed Care – PPO | Admitting: *Deleted

## 2023-07-29 DIAGNOSIS — J309 Allergic rhinitis, unspecified: Secondary | ICD-10-CM | POA: Diagnosis not present

## 2023-08-16 DIAGNOSIS — R269 Unspecified abnormalities of gait and mobility: Secondary | ICD-10-CM | POA: Diagnosis not present

## 2023-08-16 DIAGNOSIS — M79672 Pain in left foot: Secondary | ICD-10-CM | POA: Diagnosis not present

## 2023-08-16 DIAGNOSIS — S82892D Other fracture of left lower leg, subsequent encounter for closed fracture with routine healing: Secondary | ICD-10-CM | POA: Diagnosis not present

## 2023-08-16 DIAGNOSIS — R531 Weakness: Secondary | ICD-10-CM | POA: Diagnosis not present

## 2023-08-26 ENCOUNTER — Other Ambulatory Visit (HOSPITAL_COMMUNITY): Payer: Self-pay | Admitting: Cardiology

## 2023-08-26 DIAGNOSIS — E782 Mixed hyperlipidemia: Secondary | ICD-10-CM

## 2023-09-05 ENCOUNTER — Ambulatory Visit (INDEPENDENT_AMBULATORY_CARE_PROVIDER_SITE_OTHER): Payer: Self-pay | Admitting: *Deleted

## 2023-09-05 DIAGNOSIS — J309 Allergic rhinitis, unspecified: Secondary | ICD-10-CM | POA: Diagnosis not present

## 2023-09-12 ENCOUNTER — Ambulatory Visit (INDEPENDENT_AMBULATORY_CARE_PROVIDER_SITE_OTHER): Payer: Self-pay

## 2023-09-12 DIAGNOSIS — J309 Allergic rhinitis, unspecified: Secondary | ICD-10-CM

## 2023-09-19 ENCOUNTER — Ambulatory Visit (INDEPENDENT_AMBULATORY_CARE_PROVIDER_SITE_OTHER): Payer: Self-pay | Admitting: *Deleted

## 2023-09-19 DIAGNOSIS — J309 Allergic rhinitis, unspecified: Secondary | ICD-10-CM | POA: Diagnosis not present

## 2023-09-27 ENCOUNTER — Ambulatory Visit (INDEPENDENT_AMBULATORY_CARE_PROVIDER_SITE_OTHER): Payer: Self-pay | Admitting: *Deleted

## 2023-09-27 DIAGNOSIS — J309 Allergic rhinitis, unspecified: Secondary | ICD-10-CM

## 2023-10-04 ENCOUNTER — Ambulatory Visit (INDEPENDENT_AMBULATORY_CARE_PROVIDER_SITE_OTHER): Payer: Self-pay | Admitting: *Deleted

## 2023-10-04 DIAGNOSIS — J309 Allergic rhinitis, unspecified: Secondary | ICD-10-CM | POA: Diagnosis not present

## 2023-11-01 ENCOUNTER — Ambulatory Visit (INDEPENDENT_AMBULATORY_CARE_PROVIDER_SITE_OTHER): Payer: Self-pay | Admitting: *Deleted

## 2023-11-01 DIAGNOSIS — J309 Allergic rhinitis, unspecified: Secondary | ICD-10-CM

## 2023-11-30 ENCOUNTER — Ambulatory Visit (INDEPENDENT_AMBULATORY_CARE_PROVIDER_SITE_OTHER): Payer: Self-pay

## 2023-11-30 DIAGNOSIS — J309 Allergic rhinitis, unspecified: Secondary | ICD-10-CM

## 2023-12-12 ENCOUNTER — Other Ambulatory Visit (HOSPITAL_COMMUNITY): Payer: Self-pay | Admitting: Cardiology

## 2023-12-26 ENCOUNTER — Ambulatory Visit (INDEPENDENT_AMBULATORY_CARE_PROVIDER_SITE_OTHER): Payer: Self-pay

## 2023-12-26 DIAGNOSIS — J309 Allergic rhinitis, unspecified: Secondary | ICD-10-CM

## 2024-01-11 ENCOUNTER — Encounter: Payer: Self-pay | Admitting: Internal Medicine

## 2024-01-11 ENCOUNTER — Ambulatory Visit: Admitting: Internal Medicine

## 2024-01-11 VITALS — BP 118/80 | HR 75 | Temp 98.3°F | Ht 67.0 in | Wt 133.0 lb

## 2024-01-11 DIAGNOSIS — F419 Anxiety disorder, unspecified: Secondary | ICD-10-CM | POA: Diagnosis not present

## 2024-01-11 MED ORDER — MIRTAZAPINE 15 MG PO TABS: 15.0000 mg | ORAL_TABLET | Freq: Every day | ORAL | 3 refills | Status: DC

## 2024-01-11 MED ORDER — ZOLPIDEM TARTRATE 5 MG PO TABS: 5.0000 mg | ORAL_TABLET | Freq: Every evening | ORAL | 0 refills | Status: AC | PRN

## 2024-01-11 NOTE — Assessment & Plan Note (Addendum)
 Rx mirtazapine 15 mg at bedtime to help with anxiety and sleep. Rx ambien 5 mg at bedtime prn to use for sleep with travel related changes. She will let us  know in 3-4 weeks how she is doing. Denies that counseling would be helpful. Talked about coping skills.

## 2024-01-11 NOTE — Patient Instructions (Addendum)
 We have sent in mirtazepine to help with sleep and anxiety.   We have sent in ambien to use as needed for sleep with travel.

## 2024-01-11 NOTE — Progress Notes (Signed)
   Subjective:   Patient ID: Cassandra Murphy, female    DOB: [birth year], 48 y.o.   MRN: [id number]  HPI The patient is a 48 YO female coming in for problems sleeping while traveling. Some issues with falling asleep and with staying asleep. Is also having some generalized anxiety more often than desired. 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   Review of Systems  Constitutional: Negative.   HENT: Negative.    Eyes: Negative.   Respiratory:  Negative for cough, chest tightness and shortness of breath.   Cardiovascular:  Negative for chest pain, palpitations and leg swelling.  Gastrointestinal:  Negative for abdominal distention, abdominal pain, constipation, diarrhea, nausea and vomiting.  Musculoskeletal: Negative.   Skin: Negative.   Neurological: Negative.   Psychiatric/Behavioral:  Positive for sleep disturbance. The patient is nervous/anxious.     Objective:  Physical Exam Constitutional:      Appearance: She is well-developed.  HENT:     Head: Normocephalic and atraumatic.  Cardiovascular:     Rate and Rhythm: Normal rate and regular rhythm.  Pulmonary:     Effort: Pulmonary effort is normal. No respiratory distress.     Breath sounds: Normal breath sounds. No wheezing or rales.  Abdominal:     General: Bowel sounds are normal. There is no distension.     Palpations: Abdomen is soft.     Tenderness: There is no abdominal tenderness. There is no rebound.  Musculoskeletal:     Cervical back: Normal range of motion.  Skin:    General: Skin is warm and dry.  Neurological:     Mental Status: She is alert and oriented to person, place, and time.     Coordination: Coordination normal.     Vitals:   01/11/24 0900  BP: 118/80  Pulse: 75  Temp: 98.3 F (36.8 C)  TempSrc: Oral  SpO2: 99%  Weight: 133 lb (60.3 kg)  Height: 5\' 7"  (1.702 m)    Assessment & Plan:

## 2024-01-15 ENCOUNTER — Encounter: Payer: Self-pay | Admitting: Internal Medicine

## 2024-01-16 NOTE — Telephone Encounter (Signed)
**Note De-identified  [place] Obfuscation** Please advise 

## 2024-01-17 MED ORDER — HYDROXYZINE HCL 10 MG PO TABS: 10.0000 mg | ORAL_TABLET | Freq: Every evening | ORAL | 0 refills | Status: AC | PRN

## 2024-01-19 ENCOUNTER — Ambulatory Visit: Payer: BC Managed Care – PPO | Admitting: Internal Medicine

## 2024-02-02 ENCOUNTER — Ambulatory Visit

## 2024-02-02 DIAGNOSIS — J309 Allergic rhinitis, unspecified: Secondary | ICD-10-CM

## 2024-03-01 ENCOUNTER — Ambulatory Visit (INDEPENDENT_AMBULATORY_CARE_PROVIDER_SITE_OTHER)

## 2024-03-01 DIAGNOSIS — J309 Allergic rhinitis, unspecified: Secondary | ICD-10-CM | POA: Diagnosis not present

## 2024-03-08 DIAGNOSIS — J301 Allergic rhinitis due to pollen: Secondary | ICD-10-CM | POA: Diagnosis not present

## 2024-03-08 NOTE — Progress Notes (Signed)
 VIALS MADE ON 03/08/24

## 2024-03-09 DIAGNOSIS — J3089 Other allergic rhinitis: Secondary | ICD-10-CM | POA: Diagnosis not present

## 2024-03-26 ENCOUNTER — Ambulatory Visit: Admitting: Internal Medicine

## 2024-03-28 ENCOUNTER — Ambulatory Visit (INDEPENDENT_AMBULATORY_CARE_PROVIDER_SITE_OTHER)

## 2024-03-28 ENCOUNTER — Ambulatory Visit: Admitting: Family

## 2024-03-28 DIAGNOSIS — J309 Allergic rhinitis, unspecified: Secondary | ICD-10-CM | POA: Diagnosis not present

## 2024-04-09 NOTE — Patient Instructions (Incomplete)
 1. Seasonal and perennial allergic rhinitis - Continue allergy injections per protocol and have access to your epinephrine  auto injector device - Continue with cetirizine  daily as needed. - Add a nose spray for particularly runny nose days. - Add on Atrovent  one spray per nostril every 8 hours as needed.    2. Chronic urticaria - resolved  - Continue with cetirizine  10mg  daily as needed.    3. Mild intermittent asthma, uncomplicated - Continue with albuterol  as needed.   4. GERD - Continue with pantoprazole  40mg  daily.    5. Return in about 1 year

## 2024-04-10 ENCOUNTER — Ambulatory Visit (INDEPENDENT_AMBULATORY_CARE_PROVIDER_SITE_OTHER): Admitting: Family

## 2024-04-10 ENCOUNTER — Other Ambulatory Visit: Payer: Self-pay

## 2024-04-10 ENCOUNTER — Encounter: Payer: Self-pay | Admitting: Family

## 2024-04-10 VITALS — BP 111/80 | HR 71 | Temp 98.3°F | Wt 136.0 lb

## 2024-04-10 DIAGNOSIS — K219 Gastro-esophageal reflux disease without esophagitis: Secondary | ICD-10-CM

## 2024-04-10 DIAGNOSIS — J4520 Mild intermittent asthma, uncomplicated: Secondary | ICD-10-CM | POA: Diagnosis not present

## 2024-04-10 DIAGNOSIS — J302 Other seasonal allergic rhinitis: Secondary | ICD-10-CM

## 2024-04-10 DIAGNOSIS — J3089 Other allergic rhinitis: Secondary | ICD-10-CM

## 2024-04-10 MED ORDER — EPINEPHRINE 0.3 MG/0.3ML IJ SOAJ: 0.3000 mg | INTRAMUSCULAR | 1 refills | Status: AC | PRN

## 2024-04-10 MED ORDER — ALBUTEROL SULFATE HFA 108 (90 BASE) MCG/ACT IN AERS: INHALATION_SPRAY | RESPIRATORY_TRACT | 1 refills | Status: DC

## 2024-04-10 NOTE — Progress Notes (Signed)
 [address] [place] [address] Dept: [id number]  FOLLOW UP NOTE  Patient ID: Cassandra Murphy, female    DOB: 03-30-1976  Age: 48 y.o. MRN: [id number] Date of Office Visit: 04/10/2024  Assessment  Chief Complaint: Follow-up  HPI Cassandra Murphy is a 48 year old female who presents today for follow-up of seasonal and perennial allergic rhinitis, chronic urticaria-resolved, mild intermittent asthma, and gastroesophageal reflux disease.  She was last seen on January 19, 2022 by Dr. Iva.  She denies any new diagnosis or surgery since her last office visit.  Seasonal and perennial allergic rhinitis: She reports that at her last office visit Dr. Iva remixed the pollen vial and this has maybe helped a little.  She still will have a lot of sneezing once a month and runny nose as to where she always has to have a Kleenex.  She denies nasal congestion.  When she has these symptoms she will take a Benadryl as needed.  She also takes Allegra maybe 3 times a week.  She has not had any sinus infections since we last saw her.  She reports that she thinks that she needs to take her nose spray, but does not like to take nose sprays.  She reports that she feels clogged on her left side with a little bit of pressure and reports that this has been ongoing for 10 years.  She is able to taste and smell, but reports that she has never been great at smelling.  She feels like she has seen ENT in the past, but was told that she would need more imaging and she did not wish to do this.  She has never had sinus surgery.  Discussed that she has been on allergy injections since November 2017.  Discussed the options of stopping allergy injections, reskin testing to environmental allergens, or continuing allergy injections.  Chronic urticaria: She reports that she has not had any hives in a while and that this is went away.  She does take Allegra maybe 3 times a week.  Mild intermittent asthma: She cannot  remember when she last used her inhaler.  She has not noticed any noticeable tightness in her chest, shortness of breath, coughing, wheezing, and nocturnal awakenings due to breathing problems.  She reports maybe once a year does she have a feeling of asthma.  Since her last office visit she has not required any systemic steroids or made any trips to the emergency room or urgent care due to breathing problems.  Reflux is reported as better.  She is currently not taking any medications for reflux.   Drug Allergies:  Allergies  Allergen Reactions   Codeine Nausea And Vomiting    Review of Systems: Negative except as per HPI   Physical Exam: BP 111/80   Pulse 71   Temp 98.3 F (36.8 C)   Wt 136 lb (61.7 kg)   SpO2 98%   BMI 21.30 kg/m    Physical Exam Constitutional:      Appearance: Normal appearance.  HENT:     Head: Normocephalic and atraumatic.     Comments: Pharynx normal, eyes normal, ears normal, nose: Bilateral lower turbinates mildly edematous and slightly erythematous with no drainage noted.  Left turbinate greater than right turbinate    Right Ear: Tympanic membrane, ear canal and external ear normal.     Left Ear: Tympanic membrane, ear canal and external ear normal.     Mouth/Throat:     Mouth: Mucous membranes  are moist.     Pharynx: Oropharynx is clear.  Eyes:     Conjunctiva/sclera: Conjunctivae normal.  Cardiovascular:     Rate and Rhythm: Regular rhythm.     Heart sounds: Normal heart sounds.  Pulmonary:     Effort: Pulmonary effort is normal.     Breath sounds: Normal breath sounds.     Comments: Lungs clear to auscultation Musculoskeletal:     Cervical back: Neck supple.  Skin:    General: Skin is warm.  Neurological:     Mental Status: She is alert and oriented to person, place, and time.  Psychiatric:        Mood and Affect: Mood normal.        Behavior: Behavior normal.        Thought Content: Thought content normal.        Judgment: Judgment  normal.     Diagnostics: FVC 4.32 L (108%), FEV1 3.24 L (101%), FEV1/FVC 0.75.  Spirometry indicates normal spirometry  Assessment and Plan: 1. Seasonal and perennial allergic rhinitis   2. Mild intermittent asthma, uncomplicated   3. Gastroesophageal reflux disease, unspecified whether esophagitis present     Meds ordered this encounter  Medications   EPINEPHrine  0.3 mg/0.3 mL IJ SOAJ injection    Sig: Inject 0.3 mg into the muscle as needed for anaphylaxis.    Dispense:  2 each    Refill:  1   albuterol  (VENTOLIN  HFA) 108 (90 Base) MCG/ACT inhaler    Sig: Inhale 2 puffs every 4-6 hours as needed for cough, wheeze, tightness in chest, or shortness of breath    Dispense:  8 g    Refill:  1    Patient Instructions  1. Seasonal and perennial allergic rhinitis - Continue allergy injections per protocol and have access to your epinephrine  auto injector device - Continue with Allegra daily as needed. - May use Atrovent  one spray per nostril every 8 hours as needed.  - Consider updating your skin testing to environmental allergens to see if we could stop allergy injections.  You will need to be off all antihistamines 3 days prior to this appointment   2. Chronic urticaria - resolved  - Continue with cetirizine  10mg  daily as needed.    3. Mild intermittent asthma, uncomplicated - Continue with albuterol  as needed. -Your breathing test looked good today  Asthma control goals:  Full participation in all desired activities (may need albuterol  before activity) Albuterol  use two time or less a week on average (not counting use with activity) Cough interfering with sleep two time or less a month Oral steroids no more than once a year No hospitalizations  4. GERD - Continue with dietary and lifestyle modifications   5. Return in about 1 year or sooner if needed  Return in about 1 year (around 04/10/2025), or if symptoms worsen or fail to improve.    Thank you for the opportunity  to care for this patient.  Please do not hesitate to contact me with questions.  Wanda Craze, FNP [facility] 

## 2024-04-11 ENCOUNTER — Telehealth (HOSPITAL_COMMUNITY): Payer: Self-pay | Admitting: Cardiology

## 2024-04-11 NOTE — Telephone Encounter (Signed)
 Patient called to report her wegovy  now needs PA   Meds to [facility] pharmacy team

## 2024-04-12 ENCOUNTER — Other Ambulatory Visit (HOSPITAL_COMMUNITY): Payer: Self-pay

## 2024-04-12 ENCOUNTER — Telehealth: Payer: Self-pay | Admitting: Pharmacy Technician

## 2024-04-12 NOTE — Telephone Encounter (Signed)
 Pharmacy Patient Advocate Encounter  Received notification from [facility] that Prior Authorization for wegovy  2.4mg  has been APPROVED from 04/12/24 to 04/12/25   PA #/Case ID/Reference #: [id number]

## 2024-04-12 NOTE — Telephone Encounter (Signed)
 Per pt calls  Pharmacy Patient Advocate Encounter   Received notification from Pt Calls Messages that prior authorization for wegovy  2.4mg  is required/requested.   Insurance verification completed.   The patient is insured through [facility] .   Per test claim: PA required; PA submitted to above mentioned insurance via Latent Key/confirmation #/EOC [id number] Status is pending

## 2024-04-13 ENCOUNTER — Telehealth: Payer: Self-pay | Admitting: Radiology

## 2024-04-13 NOTE — Telephone Encounter (Signed)
 Copied from CRM [id number]. Topic: Clinical - Medical Advice >> Apr 13, 2024  9:24 AM Chasity T wrote: Reason for CRM: Patient is calling to state she is having bad allergies and wants to know if an ent can see her to see what is going on due to it making her feel uncomfortable. -please contact her back to discuss

## 2024-04-13 NOTE — Telephone Encounter (Signed)
 She can call ENT. For a referral typically an appointment with us  is needed. She just saw her allergy provider did she mention this to them?

## 2024-04-13 NOTE — Telephone Encounter (Signed)
 Please Cassandra Murphy

## 2024-04-13 NOTE — Telephone Encounter (Signed)
 Called patient and she stated that she did go to the allergy provider and that told her that she may be none responsive and may have to redo the test over pt states that she will call them and check and see first if they can for a referral and if not see will call out office to make a appointment with you.

## 2024-05-07 ENCOUNTER — Ambulatory Visit (INDEPENDENT_AMBULATORY_CARE_PROVIDER_SITE_OTHER)

## 2024-05-07 DIAGNOSIS — J309 Allergic rhinitis, unspecified: Secondary | ICD-10-CM | POA: Diagnosis not present

## 2024-05-20 ENCOUNTER — Other Ambulatory Visit: Payer: Self-pay | Admitting: Family

## 2024-06-11 ENCOUNTER — Other Ambulatory Visit: Payer: Self-pay | Admitting: Internal Medicine

## 2024-06-13 ENCOUNTER — Ambulatory Visit

## 2024-06-13 DIAGNOSIS — J309 Allergic rhinitis, unspecified: Secondary | ICD-10-CM

## 2024-06-20 ENCOUNTER — Ambulatory Visit (INDEPENDENT_AMBULATORY_CARE_PROVIDER_SITE_OTHER)

## 2024-06-20 DIAGNOSIS — J309 Allergic rhinitis, unspecified: Secondary | ICD-10-CM | POA: Diagnosis not present

## 2024-06-27 ENCOUNTER — Ambulatory Visit (INDEPENDENT_AMBULATORY_CARE_PROVIDER_SITE_OTHER)

## 2024-06-27 DIAGNOSIS — J309 Allergic rhinitis, unspecified: Secondary | ICD-10-CM | POA: Diagnosis not present

## 2024-07-02 DIAGNOSIS — Z01419 Encounter for gynecological examination (general) (routine) without abnormal findings: Secondary | ICD-10-CM | POA: Diagnosis not present

## 2024-07-02 DIAGNOSIS — Z1151 Encounter for screening for human papillomavirus (HPV): Secondary | ICD-10-CM | POA: Diagnosis not present

## 2024-07-02 DIAGNOSIS — Z130 Encounter for screening for diseases of the blood and blood-forming organs and certain disorders involving the immune mechanism: Secondary | ICD-10-CM | POA: Diagnosis not present

## 2024-07-02 DIAGNOSIS — Z1231 Encounter for screening mammogram for malignant neoplasm of breast: Secondary | ICD-10-CM | POA: Diagnosis not present

## 2024-07-02 DIAGNOSIS — Z124 Encounter for screening for malignant neoplasm of cervix: Secondary | ICD-10-CM | POA: Diagnosis not present

## 2024-07-26 ENCOUNTER — Ambulatory Visit (INDEPENDENT_AMBULATORY_CARE_PROVIDER_SITE_OTHER)

## 2024-07-26 DIAGNOSIS — J309 Allergic rhinitis, unspecified: Secondary | ICD-10-CM

## 2024-08-30 ENCOUNTER — Ambulatory Visit: Admitting: *Deleted

## 2024-08-30 DIAGNOSIS — J302 Other seasonal allergic rhinitis: Secondary | ICD-10-CM
